# Patient Record
Sex: Female | Born: 1975 | Race: Black or African American | Hispanic: No | Marital: Single | Smoking: Current every day smoker
Health system: Southern US, Community
[De-identification: ages and names within clinical notes are randomized; demographics above are authoritative.]

## PROBLEM LIST (undated history)

## (undated) ENCOUNTER — Inpatient Hospital Stay (HOSPITAL_COMMUNITY): Payer: Self-pay

## (undated) DIAGNOSIS — Z789 Other specified health status: Secondary | ICD-10-CM

## (undated) DIAGNOSIS — D649 Anemia, unspecified: Secondary | ICD-10-CM

## (undated) HISTORY — DX: Anemia, unspecified: D64.9

## (undated) HISTORY — PX: FOOT SURGERY: SHX648

---

## 1997-08-15 ENCOUNTER — Ambulatory Visit (HOSPITAL_COMMUNITY): Admission: RE | Admit: 1997-08-15 | Discharge: 1997-08-15 | Payer: Self-pay | Admitting: *Deleted

## 1997-11-27 ENCOUNTER — Inpatient Hospital Stay (HOSPITAL_COMMUNITY): Admission: AD | Admit: 1997-11-27 | Discharge: 1997-11-27 | Payer: Self-pay | Admitting: Obstetrics

## 1997-12-25 ENCOUNTER — Inpatient Hospital Stay (HOSPITAL_COMMUNITY): Admission: AD | Admit: 1997-12-25 | Discharge: 1997-12-25 | Payer: Self-pay | Admitting: Obstetrics

## 1997-12-26 ENCOUNTER — Inpatient Hospital Stay (HOSPITAL_COMMUNITY): Admission: AD | Admit: 1997-12-26 | Discharge: 1997-12-28 | Payer: Self-pay | Admitting: Obstetrics & Gynecology

## 1998-12-27 ENCOUNTER — Ambulatory Visit (HOSPITAL_COMMUNITY): Admission: RE | Admit: 1998-12-27 | Discharge: 1998-12-27 | Payer: Self-pay

## 1999-04-16 ENCOUNTER — Inpatient Hospital Stay (HOSPITAL_COMMUNITY): Admission: AD | Admit: 1999-04-16 | Discharge: 1999-04-16 | Payer: Self-pay | Admitting: *Deleted

## 1999-04-17 ENCOUNTER — Inpatient Hospital Stay (HOSPITAL_COMMUNITY): Admission: AD | Admit: 1999-04-17 | Discharge: 1999-04-19 | Payer: Self-pay | Admitting: *Deleted

## 2000-09-13 ENCOUNTER — Encounter: Payer: Self-pay | Admitting: Obstetrics & Gynecology

## 2000-09-13 ENCOUNTER — Inpatient Hospital Stay (HOSPITAL_COMMUNITY): Admission: AD | Admit: 2000-09-13 | Discharge: 2000-09-13 | Payer: Self-pay | Admitting: Obstetrics & Gynecology

## 2000-09-15 ENCOUNTER — Other Ambulatory Visit: Admission: RE | Admit: 2000-09-15 | Discharge: 2000-09-15 | Payer: Self-pay | Admitting: Obstetrics and Gynecology

## 2000-11-12 ENCOUNTER — Emergency Department (HOSPITAL_COMMUNITY): Admission: EM | Admit: 2000-11-12 | Discharge: 2000-11-12 | Payer: Self-pay | Admitting: Emergency Medicine

## 2000-12-04 ENCOUNTER — Emergency Department (HOSPITAL_COMMUNITY): Admission: EM | Admit: 2000-12-04 | Discharge: 2000-12-04 | Payer: Self-pay | Admitting: Emergency Medicine

## 2001-08-08 ENCOUNTER — Inpatient Hospital Stay (HOSPITAL_COMMUNITY): Admission: AD | Admit: 2001-08-08 | Discharge: 2001-08-08 | Payer: Self-pay | Admitting: *Deleted

## 2001-10-03 ENCOUNTER — Inpatient Hospital Stay (HOSPITAL_COMMUNITY): Admission: AD | Admit: 2001-10-03 | Discharge: 2001-10-03 | Payer: Self-pay | Admitting: *Deleted

## 2001-10-04 ENCOUNTER — Ambulatory Visit (HOSPITAL_COMMUNITY): Admission: RE | Admit: 2001-10-04 | Discharge: 2001-10-04 | Payer: Self-pay | Admitting: *Deleted

## 2002-02-12 ENCOUNTER — Inpatient Hospital Stay (HOSPITAL_COMMUNITY): Admission: AD | Admit: 2002-02-12 | Discharge: 2002-02-12 | Payer: Self-pay | Admitting: *Deleted

## 2002-03-17 ENCOUNTER — Encounter (HOSPITAL_COMMUNITY): Admission: RE | Admit: 2002-03-17 | Discharge: 2002-03-17 | Payer: Self-pay | Admitting: *Deleted

## 2002-03-21 ENCOUNTER — Inpatient Hospital Stay (HOSPITAL_COMMUNITY): Admission: AD | Admit: 2002-03-21 | Discharge: 2002-03-23 | Payer: Self-pay | Admitting: Family Medicine

## 2002-12-13 ENCOUNTER — Inpatient Hospital Stay (HOSPITAL_COMMUNITY): Admission: AD | Admit: 2002-12-13 | Discharge: 2002-12-13 | Payer: Self-pay | Admitting: *Deleted

## 2003-04-12 ENCOUNTER — Emergency Department (HOSPITAL_COMMUNITY): Admission: EM | Admit: 2003-04-12 | Discharge: 2003-04-13 | Payer: Self-pay | Admitting: Emergency Medicine

## 2003-05-04 ENCOUNTER — Emergency Department (HOSPITAL_COMMUNITY): Admission: EM | Admit: 2003-05-04 | Discharge: 2003-05-04 | Payer: Self-pay | Admitting: Emergency Medicine

## 2003-09-20 ENCOUNTER — Inpatient Hospital Stay (HOSPITAL_COMMUNITY): Admission: AD | Admit: 2003-09-20 | Discharge: 2003-09-20 | Payer: Self-pay | Admitting: Family Medicine

## 2003-11-21 ENCOUNTER — Emergency Department (HOSPITAL_COMMUNITY): Admission: EM | Admit: 2003-11-21 | Discharge: 2003-11-21 | Payer: Self-pay | Admitting: Emergency Medicine

## 2003-11-25 ENCOUNTER — Inpatient Hospital Stay (HOSPITAL_COMMUNITY): Admission: AD | Admit: 2003-11-25 | Discharge: 2003-11-25 | Payer: Self-pay | Admitting: Gynecology

## 2003-12-12 ENCOUNTER — Inpatient Hospital Stay (HOSPITAL_COMMUNITY): Admission: AD | Admit: 2003-12-12 | Discharge: 2003-12-15 | Payer: Self-pay | Admitting: *Deleted

## 2003-12-28 ENCOUNTER — Emergency Department (HOSPITAL_COMMUNITY): Admission: EM | Admit: 2003-12-28 | Discharge: 2003-12-28 | Payer: Self-pay | Admitting: Emergency Medicine

## 2004-03-01 ENCOUNTER — Inpatient Hospital Stay (HOSPITAL_COMMUNITY): Admission: AD | Admit: 2004-03-01 | Discharge: 2004-03-01 | Payer: Self-pay | Admitting: Obstetrics & Gynecology

## 2004-04-16 ENCOUNTER — Inpatient Hospital Stay (HOSPITAL_COMMUNITY): Admission: AD | Admit: 2004-04-16 | Discharge: 2004-04-16 | Payer: Self-pay | Admitting: Obstetrics and Gynecology

## 2004-04-30 ENCOUNTER — Ambulatory Visit: Payer: Self-pay | Admitting: *Deleted

## 2004-05-14 ENCOUNTER — Ambulatory Visit: Payer: Self-pay | Admitting: Family Medicine

## 2004-05-15 ENCOUNTER — Inpatient Hospital Stay (HOSPITAL_COMMUNITY): Admission: AD | Admit: 2004-05-15 | Discharge: 2004-05-17 | Payer: Self-pay | Admitting: Family Medicine

## 2004-11-22 ENCOUNTER — Emergency Department (HOSPITAL_COMMUNITY): Admission: EM | Admit: 2004-11-22 | Discharge: 2004-11-22 | Payer: Self-pay | Admitting: Emergency Medicine

## 2005-08-02 ENCOUNTER — Inpatient Hospital Stay (HOSPITAL_COMMUNITY): Admission: AD | Admit: 2005-08-02 | Discharge: 2005-08-02 | Payer: Self-pay | Admitting: Family Medicine

## 2005-10-13 ENCOUNTER — Inpatient Hospital Stay (HOSPITAL_COMMUNITY): Admission: AD | Admit: 2005-10-13 | Discharge: 2005-10-13 | Payer: Self-pay | Admitting: Gynecology

## 2005-10-29 ENCOUNTER — Ambulatory Visit (HOSPITAL_COMMUNITY): Admission: RE | Admit: 2005-10-29 | Discharge: 2005-10-29 | Payer: Self-pay | Admitting: Gynecology

## 2006-02-02 ENCOUNTER — Ambulatory Visit: Payer: Self-pay | Admitting: Obstetrics & Gynecology

## 2006-02-02 ENCOUNTER — Encounter (INDEPENDENT_AMBULATORY_CARE_PROVIDER_SITE_OTHER): Payer: Self-pay | Admitting: *Deleted

## 2006-02-02 ENCOUNTER — Inpatient Hospital Stay (HOSPITAL_COMMUNITY): Admission: AD | Admit: 2006-02-02 | Discharge: 2006-02-05 | Payer: Self-pay | Admitting: Obstetrics and Gynecology

## 2008-04-06 ENCOUNTER — Emergency Department (HOSPITAL_COMMUNITY): Admission: EM | Admit: 2008-04-06 | Discharge: 2008-04-06 | Payer: Self-pay | Admitting: Emergency Medicine

## 2008-11-20 ENCOUNTER — Ambulatory Visit: Payer: Self-pay | Admitting: Family

## 2008-11-20 ENCOUNTER — Inpatient Hospital Stay (HOSPITAL_COMMUNITY): Admission: AD | Admit: 2008-11-20 | Discharge: 2008-11-20 | Payer: Self-pay | Admitting: Obstetrics & Gynecology

## 2010-02-07 ENCOUNTER — Emergency Department (HOSPITAL_COMMUNITY): Admission: EM | Admit: 2010-02-07 | Discharge: 2010-02-07 | Payer: Self-pay | Admitting: Emergency Medicine

## 2010-08-25 LAB — WET PREP, GENITAL

## 2010-08-25 LAB — URINE MICROSCOPIC-ADD ON

## 2010-08-25 LAB — URINALYSIS, ROUTINE W REFLEX MICROSCOPIC
Ketones, ur: NEGATIVE mg/dL
Leukocytes, UA: NEGATIVE

## 2010-08-25 LAB — GC/CHLAMYDIA PROBE AMP, GENITAL: GC Probe Amp, Genital: NEGATIVE

## 2010-10-04 NOTE — Discharge Summary (Signed)
NAME:  Alicia [name], Alicia [name]                        ACCOUNT NO.:  [id number]   MEDICAL RECORD NO.:  [id number]                   PATIENT TYPE:  INP   LOCATION:  9313                                 [facility]ILITY[facility]  [facility]   PHYSICIAN:  Conni Elliot, M.D.             DATE OF BIRTH:  August 16, 1975   DATE OF ADMISSION:  12/12/2003  DATE OF DISCHARGE:  12/15/2003                                 DISCHARGE SUMMARY   ADMISSION DIAGNOSES:  1. Pyelonephritis.  32. A 35 year old G5, P3-0-1-3 at 19 weeks, 3 days.   DISCHARGE DIAGNOSES:  60. A 35 year old G5, P3-0-1-3 at 19 weeks, 5 days.  2. Resolving pyelonephritis.   DISCHARGE MEDICATIONS:  Keflex 500 mg q.i.d. x7 days, then 500 mg daily for  suppression therapy.   LABORATORY STUDIES:  UA: Appearance cloudy.  Hemoglobin large, protein 100  mg/dL, nitrite negative.  Leukocyte esterase small.  Bacteria is few.  Culture: 20,000 colonies of gram-negative rods.  Specimen was catheterized  urine.   ADMISSION HISTORY:  Alicia [name] presented to the MAU complaining of onset of  frequency, urgency, and pain with urination since the morning of December 12, 2003.  Symptoms became more severe that night.  The patient did not have any  prenatal care.  The patient had ultrasound in the MAU 2 weeks ago when she  came to the MAU with abdominal pain.   HOSPITAL COURSE:  Problem 1. Pyelonephritis.  The patient was started on  cefoxitin 1 g q.6h.  The patient remained afebrile during hospital stay.  Right CVA tenderness noted in MAU on admission resolved by discharge date of  December 15, 2003.  The patient's urine culture was positive for E. coli and  sensitive to cefazolin.  The patient was discharged home on Keflex 500 mg  p.o. daily x7 days, then 500 mg daily for suppression therapy due to the  fact that the patient had been taking Macrobid for suppression therapy for  recurrent UTI.   Problem 2. No prenatal care.  The patient will follow up in [facility]  this  week.  She states she understands the importance of prenatal care and  agrees to comply with plan of care for pregnancy.   INSTRUCTIONS GIVEN TO PATIENT:  The patient was given prescriptions for  prenatal vitamins and prescription for Keflex both to treat the UTI and for  suppression therapy.  The patient was told to follow up in the [facility] and make an appointment this week.  The patient voiced understanding  of plan of care.     Bonnita Hollow, M.D.               Conni Elliot, M.D.    VRE/MEDQ  D:  12/18/2003  T:  12/18/2003  Job:  [id number]

## 2010-10-04 NOTE — Discharge Summary (Signed)
   NAME:  Alicia, [name]                        ACCOUNT NO.:  [id number]   MEDICAL RECORD NO.:  [id number]                   PATIENT TYPE:  INP   LOCATION:  9144                                 [facility]   PHYSICIAN:  Tanya S. Shawnie Pons, M.D.                DATE OF BIRTH:  June 18, 1975   DATE OF ADMISSION:  03/21/2002  DATE OF DISCHARGE:  03/23/2002                                 DISCHARGE SUMMARY   DISCHARGE DIAGNOSES:  1. Status post term vaginal delivery.  2. Postpartum hemorrhage.   DISCHARGE MEDICATIONS:  1. Ibuprofen 600 mg one p.o. q.6h. p.r.n. pain.  2. Depo Provera q.3 months.  3. Prenatal vitamins while breast-feeding.  4. Iron 325 mg one p.o. q.d. x6 weeks.  5. Colace 100 mg one p.o. q.d. while taking iron pills.   DISCHARGE INSTRUCTIONS:  Diet, activity, and sexual activity as per routine.  Follow-up should be at [facility] in six weeks.   BRIEF HOSPITAL COURSE:  The patient is a 35 year old G58, now P3 who  presented to [facility] at 41 and 4/7 weeks with spontaneous rupture  of membranes of clear fluid and contractions and was admitted.  Went on to  have an NSVD under epidural anesthesia.  A viable female infant was  delivered at 0640 on March 21, 2002 with Apgars of 9 at one minute and 9  at five minutes.  Infant was sucked on the perineum.  A three vessel cord  and placenta were spontaneously delivered at 0643.  Estimated blood loss at  delivery was less than 400 cc.  There were no lacerations.  The patient  suffered from postpartum hemorrhage.  Shortly following delivery received  Methergine, Pitocin, and fundal massage to control the bleeding and in  addition received IV fluid bolus and maintenance fluids. The patient was  controlled and decreased over postpartum day number one and two.  Hemoglobin  at discharge was 8.1.   Final plan is to breast and bottle feed her baby.  She was given Depo  Provera IM for contraception.   PRENATAL  LABORATORIES:  Significant for a positive Chlamydia which had been  treated previously during the pregnancy.  Otherwise, O+.  Antibody negative.  RPR nonreactive.  Rubella immune.  HB antigen negative.  GBS negative.  HIV  negative.  GC negative.   SURGEON:  Dr. Shawnie Pons was attending the delivery.   DISPOSITION:  At the time of discharge the patient was feeling well with  minimal lochia and no abdominal tenderness.  She is to be discharged to home  with follow-up and prescriptions as above.     Douglass Rivers, M.D.                      Shelbie Proctor. Shawnie Pons, M.D.    CH/MEDQ  D:  03/23/2002  T:  03/23/2002  Job:  [id number]

## 2010-10-04 NOTE — Discharge Summary (Signed)
NAMEHILLIARY, JOCK              ACCOUNT NO.:  [id number]   MEDICAL RECORD NO.:  [id number]          PATIENT TYPE:  INP   LOCATION:  9319                          [facility]  [facility]   PHYSICIAN:  Phil D. Okey Dupre, M.D.     DATE OF BIRTH:  [birth year]   DATE OF ADMISSION:  02/02/2006  DATE OF DISCHARGE:  02/05/2006                                 DISCHARGE SUMMARY   The patient is a 35 year old gravida 6, para 4-1-1-5 who had a low  transverse cesarean section on the day of admission for nonreassuring fetal  heart, was delivered at 36-3/7 weeks a female infant who was transferred to  the NICU for repair of a congenital heart defect.  The patient had an  unremarkable postoperative course with hematocrit of 31, hemoglobin of 10.7.   She was discharged on ibuprofen, prenatal vitamins, Colace with pelvic rest  for 6 weeks. She is to return to the [facility] for her 6-week checkup.           ______________________________  Javier Glazier. Okey Dupre, M.D.     PDR/MEDQ  D:  03/15/2006  T:  03/16/2006  Job:  [id number]

## 2010-10-04 NOTE — Op Note (Signed)
NAMEVIVIAN, Alicia [name]              ACCOUNT NO.:  [id number]   MEDICAL RECORD NO.:  [id number]          PATIENT TYPE:  INP   LOCATION:  9319                          [facility]   PHYSICIAN:  Lesly Dukes, M.D. DATE OF BIRTH:  25-Nov-1975   DATE OF PROCEDURE:  02/02/2006  DATE OF DISCHARGE:                                 OPERATIVE REPORT   PREOPERATIVE DIAGNOSIS:  This 35 year old G5, P4 at 36-1 weeks estimated  gestational age with decreased fetal movement for two days and nonreassuring  fetal tracings.   POSTOPERATIVE DIAGNOSIS:  This 35 year old G5, P4 at 36-1 weeks estimated  gestational age with decreased fetal movement for two days and nonreassuring  fetal tracings plus intrauterine growth retardation.   PROCEDURE:  Primary low transverse cesarean section.   SURGEON:  Lesly Dukes, M.D.   ASSISTANT:  Marc Morgans. Mayford Knife, M.D.   ANESTHESIA:  Spinal.   PATHOLOGY:  Placenta, to pathology.   ESTIMATED BLOOD LOSS:  800 cc.   COMPLICATIONS:  None.   FINDINGS:  A viable female infant.  Apgars 6 at one minute, 8 at five minutes.  Vertex.  Meconium-stained fluid.  Her fetal cord pH equaled 7.1.  Pediatric  team at delivery.   PROCEDURE:  After informed consent was obtained, the patient was taken to  the operating room, where spinal anesthesia was induced.  The patient was  placed in a dorsal supine position and prepped and draped in a normal  sterile fashion.  The Foley was left in the bladder.  A Pfannenstiel skin  incision was made with the scalpel and carried down to the fascia.  The  fascia was incised and extended bilaterally.  The rectus muscles were  separated from the fascia and separated in the midline.  The peritoneum was  identified and entered bluntly.  The bladder blade was inserted, and the  uterine incision was made in a transverse fashion in the lower uterine  segment.  The baby's head delivered.  The nose and mouth were suctioned.  The body delivered  easily.  The cord was clamped and cut, and the baby was  handed off to the waiting pediatrician.  Cord gas was sent  __________  7.1.  The placenta delivered manually and was sent to pathology.  The uterus was  cleared of all clots and debris.  The uterus was closed with 0 Vicryl in a  running, locked fashion.  A second suture of 0 Monocryl was used to  reinforce the incision.  The gutters were cleared of all clots and debris.  The uterus was noted to be hemostatic one last time.  The rectus muscles and  peritoneum were hemostatic.  The fascia was closed with 0 Vicryl in a  running fashion.  The hemostasis was noted.  Subcutaneous tissues copiously irrigated and found to be hemostasis.  The  __________ , and dressing was placed on top of the incision.  Patient  tolerated the procedure well.  Sponge, lap, instrument, and needle count  correct x2.  Patient went to the recovery room in stable condition.  ______________________________  Lesly Dukes, M.D.     KHL/MEDQ  D:  02/02/2006  T:  02/03/2006  Job:  [id number]

## 2010-11-27 ENCOUNTER — Emergency Department (HOSPITAL_COMMUNITY)
Admission: EM | Admit: 2010-11-27 | Discharge: 2010-11-27 | Disposition: A | Discharge status: Home / Self Care | Payer: Self-pay | Attending: Emergency Medicine | Admitting: Emergency Medicine

## 2010-11-27 DIAGNOSIS — K089 Disorder of teeth and supporting structures, unspecified: Secondary | ICD-10-CM | POA: Insufficient documentation

## 2010-11-27 DIAGNOSIS — K047 Periapical abscess without sinus: Secondary | ICD-10-CM | POA: Insufficient documentation

## 2011-03-12 ENCOUNTER — Emergency Department (HOSPITAL_COMMUNITY)
Admission: EM | Admit: 2011-03-12 | Discharge: 2011-03-12 | Disposition: A | Discharge status: Home / Self Care | Payer: Self-pay | Attending: Emergency Medicine | Admitting: Emergency Medicine

## 2011-03-12 DIAGNOSIS — L0201 Cutaneous abscess of face: Secondary | ICD-10-CM | POA: Insufficient documentation

## 2011-03-12 DIAGNOSIS — L03211 Cellulitis of face: Secondary | ICD-10-CM | POA: Insufficient documentation

## 2012-04-25 ENCOUNTER — Encounter (HOSPITAL_COMMUNITY): Payer: Self-pay | Admitting: *Deleted

## 2012-04-25 ENCOUNTER — Emergency Department (HOSPITAL_COMMUNITY)
Admission: EM | Admit: 2012-04-25 | Discharge: 2012-04-25 | Disposition: A | Discharge status: Home / Self Care | Payer: Medicaid Other | Attending: Emergency Medicine | Admitting: Emergency Medicine

## 2012-04-25 DIAGNOSIS — L03211 Cellulitis of face: Secondary | ICD-10-CM | POA: Insufficient documentation

## 2012-04-25 DIAGNOSIS — K0889 Other specified disorders of teeth and supporting structures: Secondary | ICD-10-CM

## 2012-04-25 DIAGNOSIS — L0201 Cutaneous abscess of face: Secondary | ICD-10-CM | POA: Insufficient documentation

## 2012-04-25 DIAGNOSIS — F17200 Nicotine dependence, unspecified, uncomplicated: Secondary | ICD-10-CM | POA: Insufficient documentation

## 2012-04-25 MED ORDER — HYDROCODONE-ACETAMINOPHEN 5-325 MG PO TABS
1.0000 | ORAL_TABLET | Freq: Once | ORAL | Status: AC
  Administered 2012-04-25: 1 via ORAL
  Filled 2012-04-25: qty 1

## 2012-04-25 MED ORDER — CEPHALEXIN 500 MG PO CAPS: 500.0000 mg | ORAL_CAPSULE | Freq: Four times a day (QID) | ORAL | Status: DC

## 2012-04-25 MED ORDER — IBUPROFEN 800 MG PO TABS: 800.0000 mg | ORAL_TABLET | Freq: Three times a day (TID) | ORAL | Status: DC

## 2012-04-25 MED ORDER — IBUPROFEN 400 MG PO TABS
800.0000 mg | ORAL_TABLET | Freq: Once | ORAL | Status: AC
  Administered 2012-04-25: 800 mg via ORAL
  Filled 2012-04-25: qty 2

## 2012-04-25 MED ORDER — CLINDAMYCIN HCL 150 MG PO CAPS: 300.0000 mg | ORAL_CAPSULE | Freq: Four times a day (QID) | ORAL | Status: DC

## 2012-04-25 NOTE — ED Notes (Signed)
Reports right lower tooth pain x 4 days. Airway intact.

## 2012-04-25 NOTE — ED Provider Notes (Signed)
History   This chart was scribed for Jones Skene, MD by Melba Coon, ED Scribe. The patient was seen in room TR05C/TR05C and the patient's care was started at 11:21AM.    CSN: [id number]  Arrival date & time 04/25/12  1002   First MD Initiated Contact with Patient 04/25/12 1107      Chief Complaint  Patient presents with  . Dental Pain    (Consider location/radiation/quality/duration/timing/severity/associated sxs/prior treatment) The history is provided by the patient. No language interpreter was used.   Alicia [name] is a 36 y.o. female who presents to the Emergency Department complaining of constant, moderate to severe right lower jaw pain pertaining to recurrent dental pain located right lower jaw with an onset 3 days ago. She has had a history of similar pain and swelling for the past year. She reports drainage from the abscess which has been present on the external aspect of her right cheek in the mid jaw line for about one year. Ibuprofen does not alleviate the pain. Reports HA. Denies visual deficits, fever, neck pain, sore throat, rash, back pain, CP, SOB, abdominal pain, nausea, emesis, or diarrhea. No known allergies. No other pertinent medical symptoms.  History reviewed. No pertinent past medical history.  History reviewed. No pertinent past surgical history.  History reviewed. No pertinent family history.  History  Substance Use Topics  . Smoking status: Current Every Day Smoker    Types: Cigarettes  . Smokeless tobacco: Not on file  . Alcohol Use: No    OB History    Grav Para Term Preterm Abortions TAB SAB Ect Mult Living                  Review of Systems 10 Systems reviewed and are negative for acute change except as noted in the HPI.  Allergies  Review of patient's allergies indicates no known allergies.  Home Medications  No current outpatient prescriptions on file.  BP 143/92  Pulse 102  Temp 98.7 F (37.1 C) (Oral)  Resp 18  SpO2  100%  LMP 03/21/2012  Physical Exam  Nursing notes reviewed.  Electronic medical record reviewed. VITAL SIGNS:   Filed Vitals:   04/25/12 1011  BP: 143/92  Pulse: 102  Temp: 98.7 F (37.1 C)  TempSrc: Oral  Resp: 18  SpO2: 100%   CONSTITUTIONAL: Awake, oriented, appears non-toxic HENT: Atraumatic, normocephalic, nickel-sized area of erythema and purulent discharge on the right mid jaw line. There is some honey crusting around a central area of purulent drainage, and eroded tissue, oral mucosa pink and moist, airway patent. Teeth had extensive work before. Tender to percussion at tooth 31. Nares patent without drainage. External ears normal. EYES: Conjunctiva clear, EOMI, PERRLA NECK: Trachea midline, non-tender, supple CARDIOVASCULAR: Normal heart rate, Normal rhythm, No murmurs, rubs, gallops PULMONARY/CHEST: Clear to auscultation, no rhonchi, wheezes, or rales. Symmetrical breath sounds. Non-tender. ABDOMINAL: Non-distended, soft, non-tender - no rebound or guarding.  BS normal. NEUROLOGIC: Non-focal, moving all four extremities, no gross sensory or motor deficits. EXTREMITIES: No clubbing, cyanosis, or edema SKIN: Warm, Dry, No erythema, No rash  ED Course  Procedures (including critical care time)  COORDINATION OF CARE:  11:24AM - pain meds and penicillin will be ordered for Alicia [name]. Hot compresses are advised at home. She is advised to see a dentist and referral will be given today. She is ready for d/c.    Labs Reviewed - No data to display No results found.   1. Pain,  dental   2. Facial abscess       MDM  Alicia [name] is a 36 y.o. female thing with facial abscess and dental pain. Patient's dental pain has been chronic, she has not gone to see a dentist about this. She is nontoxic and afebrile. She is in some discomfort I think that's what her pulse rate of 102 is due to. She does not look overtly septic, there is no surrounding erythema around the  facial abscess and there is no overt abscess in her mouth.  We'll cover the patient with 2 separate antibiotics for the facial abscess which is been present for a year and her mouth pain.  Give her some home pain medicines to go home with as well. The facial abscess is draining, have expressed more purulent material and it seems to be bleeding serous sanguinous fluid at this time. Have urged the patient to continue hot compresses at home to encourage drainage and we'll cover her for both staph and strep as well as covering her dental pain for possible apical abscess. Patient is instructed she needs to followup with a dentist for definitive care.  I explained the diagnosis and have given explicit precautions to return to the ER including high fevers, feeling worse or any other new or worsening symptoms. The patient understands and accepts the medical plan as it's been dictated and I have answered their questions. Discharge instructions concerning home care and prescriptions have been given.  The patient is STABLE and is discharged to home in good condition.   I personally performed the services described in this documentation, which was scribed in my presence. The recorded information has been reviewed and is accurate. Jones Skene, M.D.         Jones Skene, MD 04/25/12 1220

## 2012-04-30 ENCOUNTER — Inpatient Hospital Stay (HOSPITAL_COMMUNITY): Payer: Medicaid Other

## 2012-04-30 ENCOUNTER — Inpatient Hospital Stay (HOSPITAL_COMMUNITY)
Admission: AD | Admit: 2012-04-30 | Discharge: 2012-04-30 | Disposition: A | Discharge status: Home / Self Care | Payer: Medicaid Other | Source: Ambulatory Visit | Attending: Obstetrics and Gynecology | Admitting: Obstetrics and Gynecology

## 2012-04-30 ENCOUNTER — Encounter (HOSPITAL_COMMUNITY): Payer: Self-pay | Admitting: *Deleted

## 2012-04-30 DIAGNOSIS — R109 Unspecified abdominal pain: Secondary | ICD-10-CM | POA: Insufficient documentation

## 2012-04-30 DIAGNOSIS — O99891 Other specified diseases and conditions complicating pregnancy: Secondary | ICD-10-CM | POA: Insufficient documentation

## 2012-04-30 DIAGNOSIS — O26899 Other specified pregnancy related conditions, unspecified trimester: Secondary | ICD-10-CM

## 2012-04-30 LAB — CBC
HCT: 34.7 % — ABNORMAL LOW (ref 36.0–46.0)
MCH: 30.1 pg (ref 26.0–34.0)
MCHC: 32.9 g/dL (ref 30.0–36.0)
MCV: 91.6 fL (ref 78.0–100.0)
Platelets: 269 10*3/uL (ref 150–400)
RBC: 3.79 MIL/uL — ABNORMAL LOW (ref 3.87–5.11)
RDW: 13.2 % (ref 11.5–15.5)
WBC: 5.8 10*3/uL (ref 4.0–10.5)

## 2012-04-30 LAB — URINALYSIS, ROUTINE W REFLEX MICROSCOPIC
Leukocytes, UA: NEGATIVE
Nitrite: NEGATIVE
Protein, ur: NEGATIVE mg/dL
Specific Gravity, Urine: >1.03 — ABNORMAL HIGH (ref 1.005–1.030)

## 2012-04-30 LAB — URINE MICROSCOPIC-ADD ON

## 2012-04-30 LAB — WET PREP, GENITAL
Clue Cells Wet Prep HPF POC: NONE SEEN
Trich, Wet Prep: NONE SEEN
Yeast Wet Prep HPF POC: NONE SEEN

## 2012-04-30 LAB — HCG, QUANTITATIVE, PREGNANCY: hCG, Beta Chain, Quant, S: 35815 m[IU]/mL — ABNORMAL HIGH (ref <5)

## 2012-04-30 LAB — ABO/RH: ABO/RH(D): O POS

## 2012-04-30 NOTE — MAU Note (Signed)
C/o abdominal cramping with some diarrhea; taking meds for abscess tooth (causing diarrhea); +UPT at home;

## 2012-04-30 NOTE — MAU Provider Note (Signed)
Chief Complaint: Abdominal Cramping   First Provider Initiated Contact with Patient 04/30/12 1104     SUBJECTIVE HPI: Alicia [name] is a 36 y.o. [id number] at [redacted]w[redacted]d by LMP who presents with abdominal cramping. The patient denies vaginal bleeding or abnormal discharge. She has been having abdominal cramping associated with loose stools only.She has had two loose stools, once last night and one this morning. She is on Keflex and Clindamycin for a tooth abscess. She also complains of mid-thoracic back pain. She had a positive pregnancy test at home. She has received [facility] care at the [facility] for previous pregnancies.   No past medical history on file. OB History    Grav Para Term Preterm Abortions TAB SAB Ect Mult Living   7 5 5  1  1   5      # Outc Date GA Lbr Len/2nd Wgt Sex Del Anes PTL Lv   1 SAB            2 TRM            3 TRM            4 TRM            5 TRM            6 TRM            7 CUR              Past Surgical History  Procedure Date  . Foot surgery    History   Social History  . Marital Status: Single    Spouse Name: N/A    Number of Children: N/A  . Years of Education: N/A   Occupational History  . Not on file.   Social History Main Topics  . Smoking status: Current Every Day Smoker -- 0.5 packs/day    Types: Cigarettes  . Smokeless tobacco: Not on file  . Alcohol Use: No  . Drug Use: No  . Sexually Active: Yes    Birth Control/ Protection: None   Other Topics Concern  . Not on file   Social History Narrative  . No narrative on file   No current facility-administered medications on file prior to encounter.   Current Outpatient Prescriptions on File Prior to Encounter  Medication Sig Dispense Refill  . cephALEXin (KEFLEX) 500 MG capsule Take 1 capsule (500 mg total) by mouth 4 (four) times daily.  20 capsule  0   No Known Allergies  ROS: Pertinent items in HPI  OBJECTIVE Blood pressure 115/77, pulse 71, temperature 98.2 F (36.8 C),  temperature source Oral, resp. rate 16, height 5\' 3"  (1.6 m), weight 140 lb (63.504 kg), last menstrual period 03/10/2012. GENERAL: Well-developed, well-nourished female in no acute distress.  HEENT: Normocephalic HEART: normal rate RESP: normal effort ABDOMEN: Soft, non-tender EXTREMITIES: Nontender, no edema NEURO: Alert and oriented SPECULUM EXAM: Copious amount of white discharge, no blood noted, cervix clean  BIMANUAL: cervix  closed; uterus normal size, no adnexal tenderness or masses  LAB RESULTS Results for orders placed during the hospital encounter of 04/30/12 (from the past 24 hour(s))  URINALYSIS, ROUTINE W REFLEX MICROSCOPIC     Status: Abnormal   Collection Time   04/30/12 10:38 AM      Component Value Range   Color, Urine YELLOW  YELLOW   APPearance CLEAR  CLEAR   Specific Gravity, Urine >1.030 (*) 1.005 - 1.030   pH 6.0  5.0 - 8.0  Glucose, UA NEGATIVE  NEGATIVE mg/dL   Hgb urine dipstick TRACE (*) NEGATIVE   Bilirubin Urine NEGATIVE  NEGATIVE   Ketones, ur NEGATIVE  NEGATIVE mg/dL   Protein, ur NEGATIVE  NEGATIVE mg/dL   Urobilinogen, UA 0.2  0.0 - 1.0 mg/dL   Nitrite NEGATIVE  NEGATIVE   Leukocytes, UA NEGATIVE  NEGATIVE  URINE MICROSCOPIC-ADD ON     Status: Abnormal   Collection Time   04/30/12 10:38 AM      Component Value Range   Squamous Epithelial / LPF MANY (*) RARE   WBC, UA 0-2  <3 WBC/hpf   RBC / HPF 0-2  <3 RBC/hpf  POCT PREGNANCY, URINE     Status: Abnormal   Collection Time   04/30/12 10:43 AM      Component Value Range   Preg Test, Ur POSITIVE (*) NEGATIVE  WET PREP, GENITAL     Status: Abnormal   Collection Time   04/30/12 11:33 AM      Component Value Range   Yeast Wet Prep HPF POC NONE SEEN  NONE SEEN   Trich, Wet Prep NONE SEEN  NONE SEEN   Clue Cells Wet Prep HPF POC NONE SEEN  NONE SEEN   WBC, Wet Prep HPF POC FEW (*) NONE SEEN  CBC     Status: Abnormal   Collection Time   04/30/12 11:38 AM      Component Value Range   WBC 5.8   4.0 - 10.5 K/uL   RBC 3.79 (*) 3.87 - 5.11 MIL/uL   Hemoglobin 11.4 (*) 12.0 - 15.0 g/dL   HCT 45.4 (*) 09.8 - 11.9 %   MCV 91.6  78.0 - 100.0 fL   MCH 30.1  26.0 - 34.0 pg   MCHC 32.9  30.0 - 36.0 g/dL   RDW 14.7  82.9 - 56.2 %   Platelets 269  150 - 400 K/uL  HCG, QUANTITATIVE, PREGNANCY     Status: Abnormal   Collection Time   04/30/12 11:38 AM      Component Value Range   hCG, Beta Chain, Quant, S 35815 (*) <5 mIU/mL    IMAGING   MAU COURSE   ASSESSMENT Abdominal pain in early pregnancy  PLAN Discharge home Pregnancy verification letter given Patient will contact [facility] to start prenatal care. Contact information for [facility] also given in case patient is unable to start care with [facility] Recommended that patient start PNV as soon as possible Recommended increase PO water intake Patient cautioned to return to MAU should she experience vaginal bleeding or worsening of her abdominal cramping.       Follow-up Information    Follow up with [facility] MATERN[facility] ADMISSIONS. (As needed if symptoms worsen)    Contact information:   [address] [place] [address] [phone]      Call [facility]ALTH DEPT [place]. (to start prenatal care)    Contact information:   [address] [place] [address] [phone]      Call [facility]. (if you are unable to go to health department for prenatal care)    Contact information:   [address] [place] [address] [phone]          Medication List     As of 04/30/2012 12:41 PM    TAKE these medications         cephALEXin 500 MG capsule   Commonly known as: KEFLEX  Take 1 capsule (500 mg total) by mouth 4 (four) times daily.      clindamycin 150 MG capsule   Commonly known as: CLEOCIN   Take 300 mg by mouth every 6 (six) hours.           Freddi Starr, PA-C 04/30/2012  12:41 PM

## 2012-05-01 LAB — GC/CHLAMYDIA PROBE AMP: CT Probe RNA: NEGATIVE

## 2012-05-04 NOTE — MAU Provider Note (Signed)
Attestation of Attending Supervision of Advanced Practitioner (CNM/NP): Evaluation and management procedures were performed by the Advanced Practitioner under my supervision and collaboration.  I have reviewed the Advanced Practitioner's note and chart, and I agree with the management and plan.  Epsie Walthall 05/04/2012 6:03 PM

## 2012-05-19 HISTORY — DX: Maternal care for unspecified type scar from previous cesarean delivery: O34.219

## 2012-05-19 NOTE — L&D Delivery Note (Signed)
Delivery Note At 1:06 AM a viable and healthy female was delivered via Vaginal, Spontaneous Delivery (Presentation: left Occiput Anterior).  APGAR: 8, 9; weight 7#1oz.   Placenta status: Intact, Spontaneous.  Cord: 3 vessels with the following complications: None.   Anesthesia:  epidural Episiotomy: n/a Lacerations: 2nd degree perineal Suture Repair: 3.0 vicryl Est. Blood Loss (mL): 400  Mom to postpartum.  Baby to nursery-stable.  Tawni Carnes 12/28/2012, 1:41 AM  I was present for and assisted with this delivery and agree with the above documentation in the resident's note.    Rulon Abide, M.D. [facility] Fellow 12/28/2012 2:04 AM

## 2012-05-31 ENCOUNTER — Inpatient Hospital Stay (HOSPITAL_COMMUNITY)
Admission: AD | Admit: 2012-05-31 | Discharge: 2012-05-31 | Disposition: A | Discharge status: Home / Self Care | Payer: Medicaid Other | Source: Ambulatory Visit | Attending: Obstetrics & Gynecology | Admitting: Obstetrics & Gynecology

## 2012-05-31 ENCOUNTER — Encounter (HOSPITAL_COMMUNITY): Payer: Self-pay | Admitting: *Deleted

## 2012-05-31 DIAGNOSIS — O99891 Other specified diseases and conditions complicating pregnancy: Secondary | ICD-10-CM | POA: Insufficient documentation

## 2012-05-31 DIAGNOSIS — R109 Unspecified abdominal pain: Secondary | ICD-10-CM | POA: Insufficient documentation

## 2012-05-31 HISTORY — DX: Other specified health status: Z78.9

## 2012-05-31 LAB — CBC WITH DIFFERENTIAL/PLATELET
Basophils Relative: 0 % (ref 0–1)
Eosinophils Absolute: 0.2 10*3/uL (ref 0.0–0.7)
Eosinophils Relative: 4 % (ref 0–5)
HCT: 33.8 % — ABNORMAL LOW (ref 36.0–46.0)
Hemoglobin: 11.1 g/dL — ABNORMAL LOW (ref 12.0–15.0)
MCH: 29.7 pg (ref 26.0–34.0)
MCHC: 32.8 g/dL (ref 30.0–36.0)
MCV: 90.4 fL (ref 78.0–100.0)
Monocytes Absolute: 0.7 10*3/uL (ref 0.1–1.0)
Monocytes Relative: 11 % (ref 3–12)
Neutrophils Relative %: 50 % (ref 43–77)

## 2012-05-31 LAB — URINALYSIS, ROUTINE W REFLEX MICROSCOPIC
Nitrite: NEGATIVE
pH: 6 (ref 5.0–8.0)

## 2012-05-31 LAB — WET PREP, GENITAL: Clue Cells Wet Prep HPF POC: NONE SEEN

## 2012-05-31 NOTE — MAU Provider Note (Signed)
Attestation of Attending Supervision of Advanced Practitioner (PA/CNM/NP): Evaluation and management procedures were performed by the Advanced Practitioner under my supervision and collaboration.  I have reviewed the Advanced Practitioner's note and chart, and I agree with the management and plan.  Liandra Mendia, MD, FACOG Attending Obstetrician & Gynecologist Faculty Practice, [facility]  

## 2012-05-31 NOTE — MAU Note (Signed)
Pt in c/o lower abdominal pains and occas headache.  States abd pains come with sneezing and turning.  Reports pressure with urination.  Denies any bleeding.  Reports small amount of normal discharge.  EDC 12/20/11.

## 2012-05-31 NOTE — MAU Provider Note (Signed)
History     CSN: [id number]  Arrival date and time: 05/31/12 1322   First Provider Initiated Contact with Patient 05/31/12 1358      Chief Complaint  Patient presents with  . Abdominal Pain   HPI Alicia [name] is 37 y.o. [id number] [redacted]w[redacted]d weeks presenting with abdominal pain for 5 days. Headache off and on.  [facility] to [facility] for Medicaid-has appt Jan 15.  Unplanned pregnancy, plans to carry.  Denies vaginal bleeding.  Describes pain as in the lower abdomen that occurs with movement and sneezing.   Pressure with urination with dysuria.  1 sexual partner.    Past Medical History  Diagnosis Date  . No pertinent past medical history     Past Surgical History  Procedure Date  . Foot surgery     Family History  Problem Relation Age of Onset  . Other Neg Hx   . Diabetes Paternal Grandmother     History  Substance Use Topics  . Smoking status: Former Smoker -- 0.5 packs/day    Types: Cigarettes  . Smokeless tobacco: Not on file  . Alcohol Use: No    Allergies: No Known Allergies  No prescriptions prior to admission   Review of Systems  Constitutional: Negative.   HENT: Negative.   Respiratory: Negative.   Cardiovascular: Negative.   Gastrointestinal: Positive for abdominal pain (bilateral side pain).  Genitourinary: Positive for dysuria.       + for white discharge   Physical Exam   Blood pressure 125/68, pulse 86, temperature 98.3 F (36.8 C), temperature source Oral, resp. rate 18, height 5' 4.5" (1.638 m), weight 158 lb (71.668 kg), last menstrual period 03/10/2012.  Physical Exam  Constitutional: She is oriented to person, place, and time. She appears well-developed and well-nourished. No distress.  HENT:  Head: Normocephalic.  Neck: Normal range of motion.  Cardiovascular: Normal rate.   Respiratory: Effort normal.  GI: Soft. She exhibits no distension and no mass. There is no tenderness. There is no rebound and no  guarding.  Genitourinary: There is no rash, tenderness or lesion on the right labia. There is no rash, tenderness or lesion on the left labia. Uterus is enlarged (10-11 week size). Uterus is not tender. Cervix exhibits friability. Cervix exhibits no discharge. Right adnexum displays no mass, no tenderness and no fullness. Left adnexum displays no mass, no tenderness and no fullness. No bleeding around the vagina. Vaginal discharge (moderate amount of white discharge without odor) found.  Neurological: She is alert and oriented to person, place, and time.  Skin: Skin is warm and dry.  Psychiatric: She has a normal mood and affect. Her behavior is normal.   Results for orders placed during the hospital encounter of 05/31/12 (from the past 24 hour(s))  URINALYSIS, ROUTINE W REFLEX MICROSCOPIC     Status: Abnormal   Collection Time   05/31/12  1:37 PM      Component Value Range   Color, Urine YELLOW  YELLOW   APPearance CLEAR  CLEAR   Specific Gravity, Urine 1.025  1.005 - 1.030   pH 6.0  5.0 - 8.0   Glucose, UA NEGATIVE  NEGATIVE mg/dL   Hgb urine dipstick TRACE (*) NEGATIVE   Bilirubin Urine NEGATIVE  NEGATIVE   Ketones, ur 15 (*) NEGATIVE mg/dL   Protein, ur NEGATIVE  NEGATIVE mg/dL   Urobilinogen, UA 0.2  0.0 - 1.0 mg/dL   Nitrite NEGATIVE  NEGATIVE  Leukocytes, UA NEGATIVE  NEGATIVE  URINE MICROSCOPIC-ADD ON     Status: Abnormal   Collection Time   05/31/12  1:37 PM      Component Value Range   Squamous Epithelial / LPF FEW (*) RARE   WBC, UA 3-6  <3 WBC/hpf   RBC / HPF 0-2  <3 RBC/hpf   Bacteria, UA FEW (*) RARE  CBC WITH DIFFERENTIAL     Status: Abnormal   Collection Time   05/31/12  2:08 PM      Component Value Range   WBC 6.2  4.0 - 10.5 K/uL   RBC 3.74 (*) 3.87 - 5.11 MIL/uL   Hemoglobin 11.1 (*) 12.0 - 15.0 g/dL   HCT 16.1 (*) 09.6 - 04.5 %   MCV 90.4  78.0 - 100.0 fL   MCH 29.7  26.0 - 34.0 pg   MCHC 32.8  30.0 - 36.0 g/dL   RDW 40.9  81.1 - 91.4 %   Platelets 256   150 - 400 K/uL   Neutrophils Relative 50  43 - 77 %   Neutro Abs 3.1  1.7 - 7.7 K/uL   Lymphocytes Relative 36  12 - 46 %   Lymphs Abs 2.2  0.7 - 4.0 K/uL   Monocytes Relative 11  3 - 12 %   Monocytes Absolute 0.7  0.1 - 1.0 K/uL   Eosinophils Relative 4  0 - 5 %   Eosinophils Absolute 0.2  0.0 - 0.7 K/uL   Basophils Relative 0  0 - 1 %   Basophils Absolute 0.0  0.0 - 0.1 K/uL  WET PREP, GENITAL     Status: Abnormal   Collection Time   05/31/12  2:30 PM      Component Value Range   Yeast Wet Prep HPF POC NONE SEEN  NONE SEEN   Trich, Wet Prep NONE SEEN  NONE SEEN   Clue Cells Wet Prep HPF POC NONE SEEN  NONE SEEN   WBC, Wet Prep HPF POC FEW (*) NONE SEEN   MAU Course  Procedures   FHR 166 by doppler.  Bedside ultrasound showed + cardiac movement and fetal movement  MDM Assessment and Plan  A:  Abdominal pain in first trimester pregnancy      Neg UA and Wet Prep  P:  Encouraged patient to stay well hydrated     May take tylenol prn for discomfort     Keep scheduled appt to begin prenatal care for Wednesday  Lalena Salas,EVE M 05/31/2012, 2:54 PM

## 2012-05-31 NOTE — MAU Note (Addendum)
Been having pain in lower abd, esp if she sneezes or moves a certain way.  Been having headaches off and on

## 2012-06-01 LAB — URINE CULTURE: Colony Count: 7000

## 2012-06-02 LAB — OB RESULTS CONSOLE RPR: RPR: NONREACTIVE

## 2012-06-02 LAB — OB RESULTS CONSOLE ANTIBODY SCREEN: Antibody Screen: NEGATIVE

## 2012-06-02 LAB — OB RESULTS CONSOLE ABO/RH

## 2012-06-02 LAB — OB RESULTS CONSOLE GC/CHLAMYDIA: Chlamydia: NEGATIVE

## 2012-06-02 LAB — CYSTIC FIBROSIS DIAGNOSTIC STUDY: Interpretation-CFDNA:: NEGATIVE

## 2012-06-03 ENCOUNTER — Other Ambulatory Visit (HOSPITAL_COMMUNITY): Payer: Self-pay | Admitting: Physician Assistant

## 2012-06-03 DIAGNOSIS — O09529 Supervision of elderly multigravida, unspecified trimester: Secondary | ICD-10-CM

## 2012-06-03 DIAGNOSIS — Z3682 Encounter for antenatal screening for nuchal translucency: Secondary | ICD-10-CM

## 2012-06-11 ENCOUNTER — Ambulatory Visit (HOSPITAL_COMMUNITY)
Admission: RE | Admit: 2012-06-11 | Discharge: 2012-06-11 | Disposition: A | Discharge status: Home / Self Care | Payer: Medicaid Other | Source: Ambulatory Visit | Attending: Physician Assistant | Admitting: Physician Assistant

## 2012-06-11 ENCOUNTER — Other Ambulatory Visit: Payer: Self-pay

## 2012-06-11 DIAGNOSIS — O351XX0 Maternal care for (suspected) chromosomal abnormality in fetus, not applicable or unspecified: Secondary | ICD-10-CM | POA: Insufficient documentation

## 2012-06-11 DIAGNOSIS — O34219 Maternal care for unspecified type scar from previous cesarean delivery: Secondary | ICD-10-CM | POA: Insufficient documentation

## 2012-06-11 DIAGNOSIS — O3510X0 Maternal care for (suspected) chromosomal abnormality in fetus, unspecified, not applicable or unspecified: Secondary | ICD-10-CM | POA: Insufficient documentation

## 2012-06-11 DIAGNOSIS — O09299 Supervision of pregnancy with other poor reproductive or obstetric history, unspecified trimester: Secondary | ICD-10-CM | POA: Insufficient documentation

## 2012-06-11 DIAGNOSIS — O09529 Supervision of elderly multigravida, unspecified trimester: Secondary | ICD-10-CM

## 2012-06-11 DIAGNOSIS — O99330 Smoking (tobacco) complicating pregnancy, unspecified trimester: Secondary | ICD-10-CM | POA: Insufficient documentation

## 2012-06-11 DIAGNOSIS — Z3682 Encounter for antenatal screening for nuchal translucency: Secondary | ICD-10-CM

## 2012-06-11 DIAGNOSIS — Z3689 Encounter for other specified antenatal screening: Secondary | ICD-10-CM | POA: Insufficient documentation

## 2012-06-11 DIAGNOSIS — Z8751 Personal history of pre-term labor: Secondary | ICD-10-CM | POA: Insufficient documentation

## 2012-06-14 ENCOUNTER — Encounter (HOSPITAL_COMMUNITY): Payer: Self-pay

## 2012-06-14 DIAGNOSIS — O09529 Supervision of elderly multigravida, unspecified trimester: Secondary | ICD-10-CM | POA: Insufficient documentation

## 2012-06-14 NOTE — Progress Notes (Signed)
Genetic Counseling  High-Risk Gestation Note  Appointment Date:  06/14/2012 Referred By: Alicia Mulling, PA Date of Birth:  1975-09-07    Pregnancy History: [id number] Estimated Date of Delivery: 12/19/12 Estimated Gestational Age: [redacted]w[redacted]d Attending: Particia Nearing, MD   Ms. Alicia [name] was seen for genetic counseling because of a maternal age of 37 y.o..     She was counseled regarding maternal age and the association with risk for chromosome conditions due to nondisjunction with aging of the ova.   We reviewed chromosomes, nondisjunction, and the associated 1 in 114 risk for fetal aneuploidy related to a maternal age of 37 y.o. at [redacted]w[redacted]d gestation.  She was counseled that the risk for aneuploidy decreases as gestational age increases, accounting for those pregnancies which spontaneously abort.  We specifically discussed Down syndrome (trisomy 45), trisomies 25 and 31, and sex chromosome aneuploidies (47,XXX and 47,XXY) including the common features and prognoses of each.   We reviewed other available screening options including First screen, Quad screen, noninvasive prenatal testing (NIPT), and detailed ultrasound. She understands that screening tests are used to modify a patient's a priori risk for aneuploidy, typically based on age.  This estimate provides a pregnancy specific risk assessment.  Specifically, we discussed that NIPT analyzes cell free fetal DNA found in the maternal circulation. This test is not diagnostic for chromosome conditions, but can provide information regarding the presence or absence of extra fetal DNA for chromosomes 13, 18, 21, X, and Y, and missing fetal DNA for chromosome X and Y (Turner syndrome). Thus, it would not identify or rule out all genetic conditions. The reported detection rate is greater than 99% for Trisomy 21, greater than 98% for Trisomy 18, and is approximately 80% (8 out of 10) for Trisomy 13. The false positive rate is reported to be less than 0.1% for  any of these conditions.  In addition, we discussed that ~50-80% of fetuses with Down syndrome and up to 90-95% of fetuses with trisomy 18/13, when well visualized, have detectable anomalies or soft markers by detailed ultrasound (~18+ weeks gestation). She was also counseled regarding diagnostic testing via chorionic villus sampling (CVS) or amniocentesis.  We reviewed the approximate 1 in 100 risk for complications for CVS and the approximate 1 in 300-500 risk for complications for amniocentesis, including spontaneous pregnancy loss. We discussed the risks, limitations, and benefits of each screening and testing option.    A complete ultrasound was performed today and nuchal translucency measurement appeared normal. 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  The ultrasound report will be sent under separate cover. An absent or hypoplastic (undeveloped, or slightly smaller than expected) nasal bone is commonly a normal variation with no associated problems.  This may be a family trait and can be more common in some ethnic groups, such as the African American population.  However, an absent or hypoplastic nasal bone has been shown to increase the chance for fetal aneuploidy, specifically Down syndrome in a pregnancy. We discussed that the ultrasound finding of absent nasal bone would increase the chance for fetal Down syndrome above the patient's age related chance of approximately 1 in 238 to approximately 1 in 28 (3.6%).   After consideration of all the options, Ms. Alicia [name] elected to proceed with cell free fetal DNA testing and declined first trimester screening, CVS, and amniocentesis.  NIPT results will be available in 8-10 days. She understands that ultrasound cannot rule out all  birth defects or genetic syndromes. The patient was advised of this limitation and states she still does not want diagnostic testing at this time. Detailed ultrasound was scheduled for  07/16/12.   Ms. Alicia [name] was provided with written information regarding sickle cell anemia (SCA) including the carrier frequency and incidence in the African-American population, the availability of carrier testing and prenatal diagnosis if indicated. Ms. Alicia [name] reported a female maternal first cousin once removed with sickle cell disease and a nephew with sickle cell trait. We discussed that given this reported family history, Ms. Alicia [name]'s chance to carry sickle cell trait is approximately 1 in 8, prior to screening. However, we discussed that it is less likely that Ms. Alicia [name] and her partner are a carrier couple for hemoglobinopathies given that their children reportedly do not have sickle cell trait or sickle cell disease. We reviewed that carrier screening is available, if not previously performed for her and her partner. In addition, we discussed that hemoglobinopathies are routinely screened for as part of the [place] newborn screening panel.  She declined hemoglobin electrophoresis today.  Both family histories were reviewed and found to be contributory for the couple's previous son with complex congenital heart disease. He is currently 36 years old. The patient reported that he was born at [redacted] weeks gestation and had 3 holes in his heart. He was transferred to [facility] after birth and has not yet required surgical correction. Ms. Alicia [name] reported that two of the holes closed and one of the holes is small. He is followed by [facility]. Ms. Alicia [name] reported that her son also had problems with his lungs, liver, and intestines at birth. She did not have information regarding a specific diagnosis or underlying etiology for his medical concerns. Ms. Alicia [name] was unsure whether or not her son has had a medical genetics evaluation to assess for an underlying cause for his features.   Congenital heart defects occur in approximately 1% of pregnancies.  Congenital heart defects may occur due to  multifactorial influences, chromosomal abnormalities, genetic syndromes or environmental exposures.  Isolated heart defects are generally multifactorial.  However, based on the patient's report of other organ systems requiring medical attention, his heart defect may not be isolated. We discussed that in the case of an isolated congenital heart defect and assuming multifactorial inheritance, the risk for a congenital heart defect in the current pregnancy would be approximately 2-3%. However, in the case of a different underlying etiology, recurrence risk may be altered. If not previously performed, a genetics evaluation is available to the patient's son to assess for an underlying etiology, which may also provide additional information regarding recurrence risk for relatives. We discussed that detailed ultrasound and fetal echocardiogram are available in the current pregnancy to assess fetal heart in detail. However, ultrasound cannot diagnose or rule out all birth defects or genetic condition. Prenatal diagnosis via CVS or amniocentesis would not be expected to be informative regarding her son's features in the current pregnancy given the absence of an identified genetic cause. Without further information regarding the provided family history, an accurate genetic risk cannot be calculated. Further genetic counseling is warranted if more information is obtained.  Ms. Ryant denied exposure to environmental toxins or chemical agents.  She denied the use of alcohol, tobacco or street drugs. She denied significant viral illnesses during the course of her pregnancy. Her medical and surgical histories were noncontributory.   I counseled Ms. Bo Merino Barba regarding the above risks and available  options.  The approximate face-to-face time with the genetic counselor was 40 minutes.  Quinn Plowman, MS,  Certified Genetic Counselor 06/14/2012

## 2012-06-16 ENCOUNTER — Encounter: Payer: Self-pay | Admitting: *Deleted

## 2012-06-16 ENCOUNTER — Encounter: Payer: Self-pay | Admitting: General Practice

## 2012-06-22 ENCOUNTER — Telehealth (HOSPITAL_COMMUNITY): Payer: Self-pay | Admitting: MS"

## 2012-06-22 NOTE — Telephone Encounter (Signed)
Patient previously provided secondary contact number for her as her significant other, Boyd Kerbs at [phone]. Left message for patient to return call.   Alicia [name] 06/22/2012 11:45 AM

## 2012-06-22 NOTE — Telephone Encounter (Signed)
Called Alicia [name] to discuss her [facility], cell free fetal DNA testing. Testing was offered because of advanced maternal age. We reviewed that these are within normal limits, showing a less than 1 in 10,000 risk for trisomies 21, 18 and 13. We reviewed that this testing identifies > 99% of pregnancies with trisomy 21, >98% of pregnancies with trisomy 65, and >80% with trisomy 1; the false positive rate is <0.1% for all conditions. Testing was also performed for X and Y chromosome analysis. This did not show evidence of aneuploidy for X or Y. It was also consistent with female gender. X and Y analysis has a detection rate of approximately 99%. She understands that this testing does not identify all genetic conditions. All questions were answered to her satisfaction, she was encouraged to call with additional questions or concerns.  Quinn Plowman, MS Certified Genetic Counselor 06/22/2012 4:01 PM

## 2012-06-22 NOTE — Telephone Encounter (Signed)
Attempted to call patient regarding cell free fetal DNA testing result. Phone number is not in service.   Alicia [name] Ioannis Schuh 06/22/2012 11:44 AM

## 2012-06-28 ENCOUNTER — Encounter: Payer: Self-pay | Admitting: *Deleted

## 2012-06-29 DIAGNOSIS — O09529 Supervision of elderly multigravida, unspecified trimester: Secondary | ICD-10-CM

## 2012-07-01 ENCOUNTER — Encounter: Payer: Self-pay | Admitting: Obstetrics & Gynecology

## 2012-07-03 ENCOUNTER — Encounter (HOSPITAL_COMMUNITY): Payer: Self-pay | Admitting: *Deleted

## 2012-07-03 ENCOUNTER — Inpatient Hospital Stay (HOSPITAL_COMMUNITY)
Admission: AD | Admit: 2012-07-03 | Discharge: 2012-07-04 | Disposition: A | Discharge status: Home / Self Care | Payer: Medicaid Other | Source: Ambulatory Visit | Attending: Obstetrics & Gynecology | Admitting: Obstetrics & Gynecology

## 2012-07-03 DIAGNOSIS — N949 Unspecified condition associated with female genital organs and menstrual cycle: Secondary | ICD-10-CM | POA: Insufficient documentation

## 2012-07-03 DIAGNOSIS — R079 Chest pain, unspecified: Secondary | ICD-10-CM | POA: Insufficient documentation

## 2012-07-03 DIAGNOSIS — L0291 Cutaneous abscess, unspecified: Secondary | ICD-10-CM

## 2012-07-03 DIAGNOSIS — O99891 Other specified diseases and conditions complicating pregnancy: Secondary | ICD-10-CM | POA: Insufficient documentation

## 2012-07-03 DIAGNOSIS — O09529 Supervision of elderly multigravida, unspecified trimester: Secondary | ICD-10-CM

## 2012-07-03 DIAGNOSIS — R109 Unspecified abdominal pain: Secondary | ICD-10-CM | POA: Insufficient documentation

## 2012-07-03 DIAGNOSIS — O26892 Other specified pregnancy related conditions, second trimester: Secondary | ICD-10-CM

## 2012-07-03 HISTORY — DX: Cutaneous abscess, unspecified: L02.91

## 2012-07-03 LAB — URINE MICROSCOPIC-ADD ON

## 2012-07-03 LAB — URINALYSIS, ROUTINE W REFLEX MICROSCOPIC
Bilirubin Urine: NEGATIVE
Ketones, ur: NEGATIVE mg/dL
Nitrite: NEGATIVE
Urobilinogen, UA: 0.2 mg/dL (ref 0.0–1.0)

## 2012-07-03 LAB — WET PREP, GENITAL
Clue Cells Wet Prep HPF POC: NONE SEEN
Trich, Wet Prep: NONE SEEN

## 2012-07-03 NOTE — MAU Note (Signed)
Pt reports sharp pain in her chest off and on for "a couple of weeks". Pt has stomach pain upper and lower regions which comes and goes. This has been happening for a couple of days. Pt denies nausea, diarrhea or constipation.

## 2012-07-03 NOTE — MAU Provider Note (Signed)
History     CSN: [id number]  Arrival date and time: 07/03/12 2108   None     Chief Complaint  Patient presents with  . Chest Pain  . Abdominal Pain   HPI  Pt is a [id number] at 15.6 wks IUP here with report of lower abdominal cramping that is intermittent in nature.  Pt denies nausea, diarrhea, or constipation.  Also reports pain in mid chest that is intermittent in nature, described as "burning".  Pain is not present at this time. No report of vaginal bleeding, abnormal vaginal discharge, or UTI symptoms.    Past Medical History  Diagnosis Date  . No pertinent past medical history   . Anemia   . Pregnancy induced hypertension     Past Surgical History  Procedure Laterality Date  . Foot surgery    . Cesarean section      Family History  Problem Relation Age of Onset  . Other Neg Hx   . Diabetes Paternal Grandmother   . Congenital heart disease Son   . Hypertension Mother     History  Substance Use Topics  . Smoking status: Current Every Day Smoker -- 0.50 packs/day    Types: Cigarettes  . Smokeless tobacco: Not on file  . Alcohol Use: No    Allergies: No Known Allergies  Prescriptions prior to admission  Medication Sig Dispense Refill  . Prenatal Vit-Fe Fumarate-FA (PRENATAL MULTIVITAMIN) TABS Take 1 tablet by mouth daily.      Marland Kitchen PRESCRIPTION MEDICATION Take 1 tablet by mouth 4 (four) times daily. Penicillin PO        Review of Systems  Constitutional: Negative.   Gastrointestinal: Positive for heartburn and abdominal pain. Negative for nausea, vomiting, diarrhea and constipation.  Genitourinary: Negative.    Physical Exam   Blood pressure 122/69, pulse 79, temperature 98.1 F (36.7 C), temperature source Oral, resp. rate 20, height 5\' 3"  (1.6 m), weight 73.936 kg (163 lb), last menstrual period 03/14/2012, SpO2 100.00%.  Physical Exam  Constitutional: She is oriented to person, place, and time. She appears well-developed and well-nourished. No  distress.  Smiles and laughs during assessment  HENT:  Head: Normocephalic.  Neck: Normal range of motion. Neck supple.  Cardiovascular: Normal rate, regular rhythm and normal heart sounds.  Exam reveals no gallop and no friction rub.   No murmur heard. Respiratory: Effort normal and breath sounds normal. No respiratory distress.  GI: Soft. She exhibits no mass. There is no tenderness. There is no rebound, no guarding and no CVA tenderness.  Fundal height 20 cm  Genitourinary: Uterus is enlarged. Cervix exhibits no motion tenderness and no discharge. Vaginal discharge (white, creamy) found.  Musculoskeletal: Normal range of motion.  Neurological: She is alert and oriented to person, place, and time.  Skin: Skin is warm and dry.  Psychiatric: She has a normal mood and affect.   FHR 153  MAU Course  Procedures  Results for orders placed during the hospital encounter of 07/03/12 (from the past 24 hour(s))  URINALYSIS, ROUTINE W REFLEX MICROSCOPIC     Status: Abnormal   Collection Time    07/03/12  9:30 PM      Result Value Range   Color, Urine YELLOW  YELLOW   APPearance CLEAR  CLEAR   Specific Gravity, Urine 1.025  1.005 - 1.030   pH 6.5  5.0 - 8.0   Glucose, UA NEGATIVE  NEGATIVE mg/dL   Hgb urine dipstick TRACE (*) NEGATIVE   Bilirubin Urine  NEGATIVE  NEGATIVE   Ketones, ur NEGATIVE  NEGATIVE mg/dL   Protein, ur NEGATIVE  NEGATIVE mg/dL   Urobilinogen, UA 0.2  0.0 - 1.0 mg/dL   Nitrite NEGATIVE  NEGATIVE   Leukocytes, UA NEGATIVE  NEGATIVE  URINE MICROSCOPIC-ADD ON     Status: Abnormal   Collection Time    07/03/12  9:30 PM      Result Value Range   Squamous Epithelial / LPF FEW (*) RARE   WBC, UA 0-2  <3 WBC/hpf   RBC / HPF 0-2  <3 RBC/hpf   Bacteria, UA RARE  RARE  WET PREP, GENITAL     Status: None   Collection Time    07/03/12 10:10 PM      Result Value Range   Yeast Wet Prep HPF POC NONE SEEN  NONE SEEN   Trich, Wet Prep NONE SEEN  NONE SEEN   Clue Cells Wet  Prep HPF POC NONE SEEN  NONE SEEN   WBC, Wet Prep HPF POC NONE SEEN  NONE SEEN     Assessment and Plan  Pelvic Pain in Pregnancy - Normal Exam  Plan: DC to home GC/CT pending Provided reassurance    [facility] 07/03/2012, 11:58 PM

## 2012-07-03 NOTE — MAU Note (Addendum)
Sharp pains in mid chest for a month. No n/v. Lower abd pains for couple days. No vag bleeding. Had first appt at [facility] but was cancelled due to bad weather.

## 2012-07-04 DIAGNOSIS — R079 Chest pain, unspecified: Secondary | ICD-10-CM

## 2012-07-04 DIAGNOSIS — R1084 Generalized abdominal pain: Secondary | ICD-10-CM

## 2012-07-04 NOTE — Progress Notes (Signed)
Written and verbal d/c instructions given and understanding voiced. 

## 2012-07-04 NOTE — Progress Notes (Signed)
Threasa Heads CNM in to discuss lab results and d/c plan with pt.

## 2012-07-05 LAB — GC/CHLAMYDIA PROBE AMP
CT Probe RNA: NEGATIVE
GC Probe RNA: NEGATIVE

## 2012-07-06 HISTORY — PX: INCISION AND DRAINAGE INTRA ORAL ABSCESS: SHX1802

## 2012-07-12 ENCOUNTER — Encounter (HOSPITAL_COMMUNITY): Payer: Self-pay | Admitting: *Deleted

## 2012-07-12 ENCOUNTER — Inpatient Hospital Stay (HOSPITAL_COMMUNITY)
Admission: AD | Admit: 2012-07-12 | Discharge: 2012-07-12 | Disposition: A | Discharge status: Home / Self Care | Payer: Medicaid Other | Source: Ambulatory Visit | Attending: Obstetrics & Gynecology | Admitting: Obstetrics & Gynecology

## 2012-07-12 DIAGNOSIS — R109 Unspecified abdominal pain: Secondary | ICD-10-CM | POA: Insufficient documentation

## 2012-07-12 DIAGNOSIS — Z331 Pregnant state, incidental: Secondary | ICD-10-CM | POA: Insufficient documentation

## 2012-07-12 DIAGNOSIS — K122 Cellulitis and abscess of mouth: Secondary | ICD-10-CM

## 2012-07-12 DIAGNOSIS — R51 Headache: Secondary | ICD-10-CM | POA: Insufficient documentation

## 2012-07-12 DIAGNOSIS — K047 Periapical abscess without sinus: Secondary | ICD-10-CM | POA: Insufficient documentation

## 2012-07-12 MED ORDER — IBUPROFEN 800 MG PO TABS: 800.0000 mg | ORAL_TABLET | Freq: Three times a day (TID) | ORAL | Status: DC | PRN

## 2012-07-12 NOTE — MAU Note (Signed)
Patient states that she was in MAU on 2-15. State after she slipped and fell on the ice in your driveway and states she hit her head hard and has been having headaches since that time. States she has been having abdominal pain. Had surgery on her teeth on 2-18.

## 2012-07-12 NOTE — MAU Provider Note (Signed)
  History     CSN: [id number]  Arrival date and time: 07/12/12 4098   First Provider Initiated Contact with Patient 07/12/12 1919      Chief Complaint  Patient presents with  . Fall  . Headache  . Abdominal Pain   HPI 37 y.o. [id number] at [redacted]w[redacted]d with headache every day since fall on 2/15, constant, has taken percocet that was prescribed for tooth surgery, but isn't helping, hasn't tried any thing else for headache. States tooth is still hurting. On PCN, had I&D of oral abscess on 2/18.    Past Medical History  Diagnosis Date  . No pertinent past medical history   . Anemia     Past Surgical History  Procedure Laterality Date  . Foot surgery    . Cesarean section    . Incision and drainage intra oral abscess Right 07/06/2012    Family History  Problem Relation Age of Onset  . Other Neg Hx   . Diabetes Paternal Grandmother   . Congenital heart disease Son   . Hypertension Mother     History  Substance Use Topics  . Smoking status: Current Every Day Smoker -- 0.50 packs/day    Types: Cigarettes  . Smokeless tobacco: Not on file  . Alcohol Use: No    Allergies: No Known Allergies  Prescriptions prior to admission  Medication Sig Dispense Refill  . Prenatal Vit-Fe Fumarate-FA (PRENATAL MULTIVITAMIN) TABS Take 1 tablet by mouth daily.      Marland Kitchen PRESCRIPTION MEDICATION Take 1 tablet by mouth 4 (four) times daily. Penicillin PO        Review of Systems  Constitutional: Negative.   HENT:       + tooth pain   Respiratory: Negative.   Cardiovascular: Negative.   Gastrointestinal: Positive for abdominal pain (intermittent low abd pain, shoots through groin x x 1 mo, no pain now). Negative for nausea, vomiting, diarrhea and constipation.  Genitourinary: Negative for dysuria, urgency, frequency, hematuria and flank pain.       Negative for vaginal bleeding, vaginal discharge, dyspareunia  Musculoskeletal: Negative.   Neurological: Positive for headaches.   Psychiatric/Behavioral: Negative.    Physical Exam   Blood pressure 112/69, pulse 67, temperature 98.9 F (37.2 C), temperature source Oral, resp. rate 18, height 5\' 3"  (1.6 m), weight 165 lb (74.844 kg), last menstrual period 03/14/2012, SpO2 100.00%.  Physical Exam  Nursing note and vitals reviewed. Constitutional: She is oriented to person, place, and time. She appears well-developed and well-nourished. No distress.  Watching TV during exam and interview   HENT:  Head: Normocephalic and atraumatic.  Eyes: Conjunctivae and EOM are normal. Pupils are equal, round, and reactive to light.  Cardiovascular: Normal rate.   Respiratory: Effort normal.  GI: Soft. There is no tenderness.  Neurological: She is alert and oriented to person, place, and time.  Skin: Skin is warm.  Healing wound on right cheek from I&D of oral abscess  Psychiatric: She has a normal mood and affect.    MAU Course  Procedures    Assessment and Plan  37 y.o. [id number] at [redacted]w[redacted]d No pregnancy related concerns today - planning prenatal care in [facility] Recommend f/u with dentist tomorrow re: ongoing tooth pains and headache Recommend f/u at Urgent Care if headaches continue despite resolution of dental abscess  Mackinsey Pelland 07/12/2012, 7:20 PM

## 2012-07-12 NOTE — MAU Note (Signed)
As stated in triage, LLQ pain and lower abd pain for one month, no vaginal bleeding

## 2012-07-14 ENCOUNTER — Other Ambulatory Visit (HOSPITAL_COMMUNITY): Payer: Self-pay | Admitting: Physician Assistant

## 2012-07-14 DIAGNOSIS — O34219 Maternal care for unspecified type scar from previous cesarean delivery: Secondary | ICD-10-CM

## 2012-07-14 DIAGNOSIS — O99340 Other mental disorders complicating pregnancy, unspecified trimester: Secondary | ICD-10-CM

## 2012-07-14 DIAGNOSIS — Z8751 Personal history of pre-term labor: Secondary | ICD-10-CM

## 2012-07-14 DIAGNOSIS — O09299 Supervision of pregnancy with other poor reproductive or obstetric history, unspecified trimester: Secondary | ICD-10-CM

## 2012-07-14 DIAGNOSIS — O09529 Supervision of elderly multigravida, unspecified trimester: Secondary | ICD-10-CM

## 2012-07-16 ENCOUNTER — Encounter (HOSPITAL_COMMUNITY): Payer: Self-pay

## 2012-07-16 ENCOUNTER — Ambulatory Visit (HOSPITAL_COMMUNITY)
Admission: RE | Admit: 2012-07-16 | Discharge: 2012-07-16 | Disposition: A | Discharge status: Home / Self Care | Payer: Medicaid Other | Source: Ambulatory Visit | Attending: Physician Assistant | Admitting: Physician Assistant

## 2012-07-16 VITALS — BP 110/61 | HR 69 | Wt 164.8 lb

## 2012-07-16 DIAGNOSIS — Z8751 Personal history of pre-term labor: Secondary | ICD-10-CM | POA: Insufficient documentation

## 2012-07-16 DIAGNOSIS — O09529 Supervision of elderly multigravida, unspecified trimester: Secondary | ICD-10-CM | POA: Insufficient documentation

## 2012-07-16 DIAGNOSIS — O99330 Smoking (tobacco) complicating pregnancy, unspecified trimester: Secondary | ICD-10-CM | POA: Insufficient documentation

## 2012-07-16 DIAGNOSIS — O09299 Supervision of pregnancy with other poor reproductive or obstetric history, unspecified trimester: Secondary | ICD-10-CM

## 2012-07-16 DIAGNOSIS — Z363 Encounter for antenatal screening for malformations: Secondary | ICD-10-CM | POA: Insufficient documentation

## 2012-07-16 DIAGNOSIS — O09522 Supervision of elderly multigravida, second trimester: Secondary | ICD-10-CM

## 2012-07-16 DIAGNOSIS — O34219 Maternal care for unspecified type scar from previous cesarean delivery: Secondary | ICD-10-CM | POA: Insufficient documentation

## 2012-07-16 DIAGNOSIS — Z1389 Encounter for screening for other disorder: Secondary | ICD-10-CM | POA: Insufficient documentation

## 2012-07-16 DIAGNOSIS — O358XX0 Maternal care for other (suspected) fetal abnormality and damage, not applicable or unspecified: Secondary | ICD-10-CM | POA: Insufficient documentation

## 2012-07-16 DIAGNOSIS — O99340 Other mental disorders complicating pregnancy, unspecified trimester: Secondary | ICD-10-CM

## 2012-07-16 NOTE — Progress Notes (Signed)
Alicia [name]  was seen today for an ultrasound appointment.  See full report in [facility].  Impression: Single IUP at 18 0/7 weeks Normal detailed fetal anatomy.  Somewhat limited views of the fetal heart (aortic and ductal arch) and spine obtained. No markers associated with aneuploidy noted Normal amniotic fluid volume  NIPT (cell free fetal DNA) returned low risk for aneuploidy  Recommendations: Recommend follow-up ultrasound examination in 4 weeks to reevaluate heart and spine.  Alpha Gula, MD

## 2012-07-19 ENCOUNTER — Encounter: Payer: Medicaid Other | Admitting: Obstetrics & Gynecology

## 2012-08-05 ENCOUNTER — Ambulatory Visit (INDEPENDENT_AMBULATORY_CARE_PROVIDER_SITE_OTHER): Payer: Medicaid Other | Admitting: Family Medicine

## 2012-08-05 ENCOUNTER — Encounter: Payer: Self-pay | Admitting: Family Medicine

## 2012-08-05 VITALS — BP 112/73 | Wt 168.4 lb

## 2012-08-05 DIAGNOSIS — O09529 Supervision of elderly multigravida, unspecified trimester: Secondary | ICD-10-CM

## 2012-08-05 DIAGNOSIS — O09299 Supervision of pregnancy with other poor reproductive or obstetric history, unspecified trimester: Secondary | ICD-10-CM

## 2012-08-05 DIAGNOSIS — O34219 Maternal care for unspecified type scar from previous cesarean delivery: Secondary | ICD-10-CM

## 2012-08-05 DIAGNOSIS — O09522 Supervision of elderly multigravida, second trimester: Secondary | ICD-10-CM

## 2012-08-05 DIAGNOSIS — Z302 Encounter for sterilization: Secondary | ICD-10-CM | POA: Insufficient documentation

## 2012-08-05 DIAGNOSIS — O9989 Other specified diseases and conditions complicating pregnancy, childbirth and the puerperium: Secondary | ICD-10-CM

## 2012-08-05 DIAGNOSIS — O0990 Supervision of high risk pregnancy, unspecified, unspecified trimester: Secondary | ICD-10-CM

## 2012-08-05 DIAGNOSIS — Z283 Underimmunization status: Secondary | ICD-10-CM

## 2012-08-05 DIAGNOSIS — O09292 Supervision of pregnancy with other poor reproductive or obstetric history, second trimester: Secondary | ICD-10-CM

## 2012-08-05 LAB — POCT URINALYSIS DIP (DEVICE)
Ketones, ur: NEGATIVE mg/dL
Protein, ur: NEGATIVE mg/dL
Specific Gravity, Urine: >=1.03 (ref 1.005–1.030)
Urobilinogen, UA: 0.2 mg/dL (ref 0.0–1.0)
pH: 6 (ref 5.0–8.0)

## 2012-08-05 NOTE — Progress Notes (Signed)
New OB transfer from [facility] for h/o LBW with possible pre-eclampsia.  Pt. Reports elevated BP and proteinuria. Had C-section, desires TOLAC.

## 2012-08-05 NOTE — Progress Notes (Signed)
Nutrition note: 1st visit consult Pt has gained 14.4# @ [redacted]w[redacted]d, which is slightly > expected. Pt reports eating 2 meals & 3-4 snacks/d. Pt is not taking PNV currently. Pt reports nausea occ but no heartburn. NKFA. Pt received verbal & written education on general nutrition during pregnancy. Disc importance of PNV or 2 chewable multivitamins. Disc wt gain goals of 15-25# or 0.6#/wk. Pt agrees to start taking PNV again. Pt has WIC & plans to BF. F/u if referred Blondell Reveal, MS, RD, LDN

## 2012-08-05 NOTE — Patient Instructions (Addendum)
Pregnancy - Second Trimester The second trimester of pregnancy (3 to 6 months) is a period of rapid growth for you and your baby. At the end of the sixth month, your baby is about 9 inches long and weighs 1 1/2 pounds. You will begin to feel the baby move between 18 and 20 weeks of the pregnancy. This is called quickening. Weight gain is faster. A clear fluid (colostrum) may leak out of your breasts. You may feel small contractions of the womb (uterus). This is known as false labor or Braxton-Hicks contractions. This is like a practice for labor when the baby is ready to be born. Usually, the problems with morning sickness have usually passed by the end of your first trimester. Some women develop small dark blotches (called cholasma, mask of pregnancy) on their face that usually goes away after the baby is born. Exposure to the sun makes the blotches worse. Acne may also develop in some pregnant women and pregnant women who have acne, may find that it goes away. PRENATAL EXAMS  Blood work may continue to be done during prenatal exams. These tests are done to check on your health and the probable health of your baby. Blood work is used to follow your blood levels (hemoglobin). Anemia (low hemoglobin) is common during pregnancy. Iron and vitamins are given to help prevent this. You will also be checked for diabetes between 24 and 28 weeks of the pregnancy. Some of the previous blood tests may be repeated.  The size of the uterus is measured during each visit. This is to make sure that the baby is continuing to grow properly according to the dates of the pregnancy.  Your blood pressure is checked every prenatal visit. This is to make sure you are not getting toxemia.  Your urine is checked to make sure you do not have an infection, diabetes or protein in the urine.  Your weight is checked often to make sure gains are happening at the suggested rate. This is to ensure that both you and your baby are  growing normally.  Sometimes, an ultrasound is performed to confirm the proper growth and development of the baby. This is a test which bounces harmless sound waves off the baby so your caregiver can more accurately determine due dates. Sometimes, a specialized test is done on the amniotic fluid surrounding the baby. This test is called an amniocentesis. The amniotic fluid is obtained by sticking a needle into the belly (abdomen). This is done to check the chromosomes in instances where there is a concern about possible genetic problems with the baby. It is also sometimes done near the end of pregnancy if an early delivery is required. In this case, it is done to help make sure the baby's lungs are mature enough for the baby to live outside of the womb. CHANGES OCCURING IN THE SECOND TRIMESTER OF PREGNANCY Your body goes through many changes during pregnancy. They vary from person to person. Talk to your caregiver about changes you notice that you are concerned about.  During the second trimester, you will likely have an increase in your appetite. It is normal to have cravings for certain foods. This varies from person to person and pregnancy to pregnancy.  Your lower abdomen will begin to bulge.  You may have to urinate more often because the uterus and baby are pressing on your bladder. It is also common to get more bladder infections during pregnancy (pain with urination). You can help this by  drinking lots of fluids and emptying your bladder before and after intercourse.  You may begin to get stretch marks on your hips, abdomen, and breasts. These are normal changes in the body during pregnancy. There are no exercises or medications to take that prevent this change.  You may begin to develop swollen and bulging veins (varicose veins) in your legs. Wearing support hose, elevating your feet for 15 minutes, 3 to 4 times a day and limiting salt in your diet helps lessen the problem.  Heartburn may  develop as the uterus grows and pushes up against the stomach. Antacids recommended by your caregiver helps with this problem. Also, eating smaller meals 4 to 5 times a day helps.  Constipation can be treated with a stool softener or adding bulk to your diet. Drinking lots of fluids, vegetables, fruits, and whole grains are helpful.  Exercising is also helpful. If you have been very active up until your pregnancy, most of these activities can be continued during your pregnancy. If you have been less active, it is helpful to start an exercise program such as walking.  Hemorrhoids (varicose veins in the rectum) may develop at the end of the second trimester. Warm sitz baths and hemorrhoid cream recommended by your caregiver helps hemorrhoid problems.  Backaches may develop during this time of your pregnancy. Avoid heavy lifting, wear low heal shoes and practice good posture to help with backache problems.  Some pregnant women develop tingling and numbness of their hand and fingers because of swelling and tightening of ligaments in the wrist (carpel tunnel syndrome). This goes away after the baby is born.  As your breasts enlarge, you may have to get a bigger bra. Get a comfortable, cotton, support bra. Do not get a nursing bra until the last month of the pregnancy if you will be nursing the baby.  You may get a dark line from your belly button to the pubic area called the linea nigra.  You may develop rosy cheeks because of increase blood flow to the face.  You may develop spider looking lines of the face, neck, arms and chest. These go away after the baby is born. HOME CARE INSTRUCTIONS   It is extremely important to avoid all smoking, herbs, alcohol, and unprescribed drugs during your pregnancy. These chemicals affect the formation and growth of the baby. Avoid these chemicals throughout the pregnancy to ensure the delivery of a healthy infant.  Most of your home care instructions are the same  as suggested for the first trimester of your pregnancy. Keep your caregiver's appointments. Follow your caregiver's instructions regarding medication use, exercise and diet.  During pregnancy, you are providing food for you and your baby. Continue to eat regular, well-balanced meals. Choose foods such as meat, fish, milk and other low fat dairy products, vegetables, fruits, and whole-grain breads and cereals. Your caregiver will tell you of the ideal weight gain.  A physical sexual relationship may be continued up until near the end of pregnancy if there are no other problems. Problems could include early (premature) leaking of amniotic fluid from the membranes, vaginal bleeding, abdominal pain, or other medical or pregnancy problems.  Exercise regularly if there are no restrictions. Check with your caregiver if you are unsure of the safety of some of your exercises. The greatest weight gain will occur in the last 2 trimesters of pregnancy. Exercise will help you:  Control your weight.  Get you in shape for labor and delivery.  Lose weight  after you have the baby.  Wear a good support or jogging bra for breast tenderness during pregnancy. This may help if worn during sleep. Pads or tissues may be used in the bra if you are leaking colostrum.  Do not use hot tubs, steam rooms or saunas throughout the pregnancy.  Wear your seat belt at all times when driving. This protects you and your baby if you are in an accident.  Avoid raw meat, uncooked cheese, cat litter boxes and soil used by cats. These carry germs that can cause birth defects in the baby.  The second trimester is also a good time to visit your dentist for your dental health if this has not been done yet. Getting your teeth cleaned is OK. Use a soft toothbrush. Brush gently during pregnancy.  It is easier to loose urine during pregnancy. Tightening up and strengthening the pelvic muscles will help with this problem. Practice stopping  your urination while you are going to the bathroom. These are the same muscles you need to strengthen. It is also the muscles you would use as if you were trying to stop from passing gas. You can practice tightening these muscles up 10 times a set and repeating this about 3 times per day. Once you know what muscles to tighten up, do not perform these exercises during urination. It is more likely to contribute to an infection by backing up the urine.  Ask for help if you have financial, counseling or nutritional needs during pregnancy. Your caregiver will be able to offer counseling for these needs as well as refer you for other special needs.  Your skin may become oily. If so, wash your face with mild soap, use non-greasy moisturizer and oil or cream based makeup. MEDICATIONS AND DRUG USE IN PREGNANCY  Take prenatal vitamins as directed. The vitamin should contain 1 milligram of folic acid. Keep all vitamins out of reach of children. Only a couple vitamins or tablets containing iron may be fatal to a baby or young child when ingested.  Avoid use of all medications, including herbs, over-the-counter medications, not prescribed or suggested by your caregiver. Only take over-the-counter or prescription medicines for pain, discomfort, or fever as directed by your caregiver. Do not use aspirin.  Let your caregiver also know about herbs you may be using.  Alcohol is related to a number of birth defects. This includes fetal alcohol syndrome. All alcohol, in any form, should be avoided completely. Smoking will cause low birth rate and premature babies.  Street or illegal drugs are very harmful to the baby. They are absolutely forbidden. A baby born to an addicted mother will be addicted at birth. The baby will go through the same withdrawal an adult does. SEEK MEDICAL CARE IF:  You have any concerns or worries during your pregnancy. It is better to call with your questions if you feel they cannot wait,  rather than worry about them. SEEK IMMEDIATE MEDICAL CARE IF:   An unexplained oral temperature above 102 F (38.9 C) develops, or as your caregiver suggests.  You have leaking of fluid from the vagina (birth canal). If leaking membranes are suspected, take your temperature and tell your caregiver of this when you call.  There is vaginal spotting, bleeding, or passing clots. Tell your caregiver of the amount and how many pads are used. Light spotting in pregnancy is common, especially following intercourse.  You develop a bad smelling vaginal discharge with a change in the color from clear  to white.  You continue to feel sick to your stomach (nauseated) and have no relief from remedies suggested. You vomit blood or coffee ground-like materials.  You lose more than 2 pounds of weight or gain more than 2 pounds of weight over 1 week, or as suggested by your caregiver.  You notice swelling of your face, hands, feet, or legs.  You get exposed to [place] measles and have never had them.  You are exposed to fifth disease or chickenpox.  You develop belly (abdominal) pain. Round ligament discomfort is a common non-cancerous (benign) cause of abdominal pain in pregnancy. Your caregiver still must evaluate you.  You develop a bad headache that does not go away.  You develop fever, diarrhea, pain with urination, or shortness of breath.  You develop visual problems, blurry, or double vision.  You fall or are in a car accident or any kind of trauma.  There is mental or physical violence at home. Document Released: 04/29/2001 Document Revised: 07/28/2011 Document Reviewed: 11/01/2008 [facility] Patient Information 2013 [place], [place].  Breastfeeding Deciding to breastfeed is one of the best choices you can make for you and your baby. The information that follows gives a brief overview of the benefits of breastfeeding as well as common topics surrounding breastfeeding. BENEFITS OF  BREASTFEEDING For the baby  The first milk (colostrum) helps the baby's digestive system function better.   There are antibodies in the mother's milk that help the baby fight off infections.   The baby has a lower incidence of asthma, allergies, and sudden infant death syndrome (SIDS).   The nutrients in breast milk are better for the baby than infant formulas, and breast milk helps the baby's brain grow better.   Babies who breastfeed have less gas, colic, and constipation.  For the mother  Breastfeeding helps develop a very special bond between the mother and her baby.   Breastfeeding is convenient, always available at the correct temperature, and costs nothing.   Breastfeeding burns calories in the mother and helps her lose weight that was gained during pregnancy.   Breastfeeding makes the uterus contract back down to normal size faster and slows bleeding following delivery.   Breastfeeding mothers have a lower risk of developing breast cancer.  BREASTFEEDING FREQUENCY  A healthy, full-term baby may breastfeed as often as every hour or space his or her feedings to every 3 hours.   Watch your baby for signs of hunger. Nurse your baby if he or she shows signs of hunger. How often you nurse will vary from baby to baby.   Nurse as often as the baby requests, or when you feel the need to reduce the fullness of your breasts.   Awaken the baby if it has been 3 4 hours since the last feeding.   Frequent feeding will help the mother make more milk and will help prevent problems, such as sore nipples and engorgement of the breasts.  BABY'S POSITION AT THE BREAST  Whether lying down or sitting, be sure that the baby's tummy is facing your tummy.   Support the breast with 4 fingers underneath the breast and the thumb above. Make sure your fingers are well away from the nipple and baby's mouth.   Stroke the baby's lips gently with your finger or nipple.   When the  baby's mouth is open wide enough, place all of your nipple and as much of the areola as possible into your baby's mouth.   Pull the baby in  close so the tip of the nose and the baby's cheeks touch the breast during the feeding.  FEEDINGS AND SUCTION  The length of each feeding varies from baby to baby and from feeding to feeding.   The baby must suck about 2 3 minutes for your milk to get to him or her. This is called a "let down." For this reason, allow the baby to feed on each breast as long as he or she wants. Your baby will end the feeding when he or she has received the right balance of nutrients.   To break the suction, put your finger into the corner of the baby's mouth and slide it between his or her gums before removing your breast from his or her mouth. This will help prevent sore nipples.  HOW TO TELL WHETHER YOUR BABY IS GETTING ENOUGH BREAST MILK. Wondering whether or not your baby is getting enough milk is a common concern among mothers. You can be assured that your baby is getting enough milk if:   Your baby is actively sucking and you hear swallowing.   Your baby seems relaxed and satisfied after a feeding.   Your baby nurses at least 8 12 times in a 24 hour time period. Nurse your baby until he or she unlatches or falls asleep at the first breast (at least 10 20 minutes), then offer the second side.   Your baby is wetting 5 6 disposable diapers (6 8 cloth diapers) in a 24 hour period by 31 82 days of age.   Your baby is having at least 3 4 stools every 24 hours for the first 6 weeks. The stool should be soft and yellow.   Your baby should gain 4 7 ounces per week after he or she is 56 days old.   Your breasts feel softer after nursing.  REDUCING BREAST ENGORGEMENT  In the first week after your baby is born, you may experience signs of breast engorgement. When breasts are engorged, they feel heavy, warm, full, and may be tender to the touch. You can reduce  engorgement if you:   Nurse frequently, every 2 3 hours. Mothers who breastfeed early and often have fewer problems with engorgement.   Place light ice packs on your breasts for 10 20 minutes between feedings. This reduces swelling. Wrap the ice packs in a lightweight towel to protect your skin. Bags of frozen vegetables work well for this purpose.   Take a warm shower or apply warm, moist heat to your breast for 5 10 minutes just before each feeding. This increases circulation and helps the milk flow.   Gently massage your breast before and during the feeding. Using your finger tips, massage from the chest wall towards your nipple in a circular motion.   Make sure that the baby empties at least one breast at every feeding before switching sides.   Use a breast pump to empty the breasts if your baby is sleepy or not nursing well. You may also want to pump if you are returning to work oryou feel you are getting engorged.   Avoid bottle feeds, pacifiers, or supplemental feedings of water or juice in place of breastfeeding. Breast milk is all the food your baby needs. It is not necessary for your baby to have water or formula. In fact, to help your breasts make more milk, it is best not to give your baby supplemental feedings during the early weeks.   Be sure the baby is latched  on and positioned properly while breastfeeding.   Wear a supportive bra, avoiding underwire styles.   Eat a balanced diet with enough fluids.   Rest often, relax, and take your prenatal vitamins to prevent fatigue, stress, and anemia.  If you follow these suggestions, your engorgement should improve in 24 48 hours. If you are still experiencing difficulty, call your lactation consultant or caregiver.  CARING FOR YOURSELF Take care of your breasts  Bathe or shower daily.   Avoid using soap on your nipples.   Start feedings on your left breast at one feeding and on your right breast at the next  feeding.   You will notice an increase in your milk supply 2 5 days after delivery. You may feel some discomfort from engorgement, which makes your breasts very firm and often tender. Engorgement "peaks" out within 24 48 hours. In the meantime, apply warm moist towels to your breasts for 5 10 minutes before feeding. Gentle massage and expression of some milk before feeding will soften your breasts, making it easier for your baby to latch on.   Wear a well-fitting nursing bra, and air dry your nipples for a 3 after each feeding.   Only use cotton bra pads.   Only use pure lanolin on your nipples after nursing. You do not need to wash it off before feeding the baby again. Another option is to express a few drops of breast milk and gently massage it into your nipples.  Take care of yourself  Eat well-balanced meals and nutritious snacks.   Drinking milk, fruit juice, and water to satisfy your thirst (about 8 glasses a day).   Get plenty of rest.  Avoid foods that you notice affect the baby in a bad way.  SEEK MEDICAL CARE IF:   You have difficulty with breastfeeding and need help.   You have a hard, red, sore area on your breast that is accompanied by a fever.   Your baby is too sleepy to eat well or is having trouble sleeping.   Your baby is wetting less than 6 diapers a day, by 27 days of age.   Your baby's skin or white part of his or her eyes is more yellow than it was in the hospital.   You feel depressed.  Document Released: 05/05/2005 Document Revised: 11/04/2011 Document Reviewed: 08/03/2011 [facility] Patient Information 2013 [place], [place]. Vaginal Birth After Cesarean Delivery Vaginal birth after Cesarean delivery (VBAC) is giving birth vaginally after previously delivering a baby by a cesarean. In the past, if a woman had a Cesarean delivery, all births afterwards would be done by Cesarean delivery. This is no longer true. It can be safe for the mother  to try a vaginal delivery after having a Cesarean. The final decision to have a VBAC or repeat Cesarean delivery should be between the patient and her caregiver. The risks and benefits can be discussed relative to the reason for, and the type of the previous Cesarean delivery. WOMEN WHO PLAN TO HAVE A VBAC SHOULD CHECK WITH THEIR DOCTOR TO BE SURE THAT:  The previous Cesarean was done with a low transverse uterine incision (not a vertical classical incision).  The birth canal is big enough for the baby.  There were no other operations on the uterus.  They will have an electronic fetal monitor (EFM) on at all times during labor.  An operating room would be available and ready in case an emergency Cesarean is needed.  A doctor and  surgical nursing staff would be available at all times during labor to be ready to do an emergency Cesarean if necessary.  An anesthesiologist would be present in case an emergency Cesarean is needed.  The nursery is prepared and has adequate personnel and necessary equipment available to care for the baby in case of an emergency Cesarean. BENEFITS OF VBAC:  Shorter stay in the hospital.  Lower delivery, nursery and hospital costs.  Less blood loss and need for blood transfusions.  Less fever and discomfort from major surgery.  Lower risk of blood clots.  Lower risk of infection.  Shorter recovery after going home.  Lower risk of other surgical complications, such as opening of the incision or hernia in the incision.  Decreased risk of injury to other organs.  Decreased risk for having to remove the uterus (hysterectomy).  Decreased risk for the placenta to completely or partially cover the opening of the uterus (placenta previa) with a future pregnancy.  Ability to have a larger family if desired. RISKS OF A VBAC:  Rupture of the uterus.  Having to remove the uterus (hysterectomy) if it ruptures.  All the complications of major surgery and/or  injury to other organs.  Excessive bleeding, blood clots and infection.  Lower Apgar scores (method to evaluate the newborn based on appearance, pulse, grimace, activity, and respiration) and more risks to the baby.  There is a higher risk of uterine rupture if you induce or augment labor.  There is a higher risk of uterine rupture if you use medications to ripen the cervix. VBAC SHOULD NOT BE DONE IF:  The previous Cesarean was done with a vertical (classical) or T-shaped incision, or you do not know what kind of an incision was made.  You had a ruptured uterus.  You had surgery on your uterus.  You have medical or obstetrical problems.  There are problems with the baby.  There were two previous Cesarean deliveries and no vaginal deliveries. OTHER FACTS TO KNOW ABOUT VBAC:  It is safe to have an epidural anesthetic with VBAC.  It is safe to turn the baby from a breech position (attempt an external cephalic version).  It is safe to try a VBAC with twins.  Pregnancies later than 40 weeks have not been successful with VBAC.  There is an increased failure rate of a VBAC in obese pregnant women.  There is an increased failure rate with VABC if the baby weighs 8.8 pounds (4000 grams) or more.  There is an increased failure rate if the time between the Cesarean and VBAC is less than 19 months.  There is an increased failure rate if pre-eclampsia is present (high blood pressure, protein in the urine and swelling of face and extremities).  VBAC is very successful if there was a previous vaginal birth.  VBAC is very successful when the labor starts spontaneously before the due date.  Delivery of VBAC is similar to having a normal spontaneous vaginal delivery. It is important to discuss VBAC with your caregiver early in the pregnancy so you can understand the risks, benefits and options. It will give you time to decide what is best in your particular case relevant to the reason for  your previous Cesarean delivery. It should be understood that medical changes in the mother or pregnancy may occur during the pregnancy, which make it necessary to change you or your caregiver's initial decision. The counseling, concerns and decisions should be documented in the medical record and signed by  all parties. Document Released: 10/26/2006 Document Revised: 07/28/2011 Document Reviewed: 06/16/2008 [facility] Patient Information 2013 [place], [place].

## 2012-08-05 NOTE — Progress Notes (Signed)
Pulse: 84

## 2012-08-13 ENCOUNTER — Ambulatory Visit (HOSPITAL_COMMUNITY)
Admission: RE | Admit: 2012-08-13 | Discharge: 2012-08-13 | Disposition: A | Discharge status: Home / Self Care | Payer: Medicaid Other | Source: Ambulatory Visit | Attending: Obstetrics & Gynecology | Admitting: Obstetrics & Gynecology

## 2012-08-13 ENCOUNTER — Encounter (HOSPITAL_COMMUNITY): Payer: Self-pay

## 2012-08-13 DIAGNOSIS — O09522 Supervision of elderly multigravida, second trimester: Secondary | ICD-10-CM

## 2012-08-13 DIAGNOSIS — O99330 Smoking (tobacco) complicating pregnancy, unspecified trimester: Secondary | ICD-10-CM | POA: Insufficient documentation

## 2012-08-13 DIAGNOSIS — Z3689 Encounter for other specified antenatal screening: Secondary | ICD-10-CM | POA: Insufficient documentation

## 2012-08-13 DIAGNOSIS — O34219 Maternal care for unspecified type scar from previous cesarean delivery: Secondary | ICD-10-CM | POA: Insufficient documentation

## 2012-08-13 DIAGNOSIS — O09529 Supervision of elderly multigravida, unspecified trimester: Secondary | ICD-10-CM | POA: Insufficient documentation

## 2012-08-13 DIAGNOSIS — Z8751 Personal history of pre-term labor: Secondary | ICD-10-CM | POA: Insufficient documentation

## 2012-08-13 DIAGNOSIS — O09299 Supervision of pregnancy with other poor reproductive or obstetric history, unspecified trimester: Secondary | ICD-10-CM | POA: Insufficient documentation

## 2012-08-13 NOTE — Progress Notes (Signed)
Ms. Boza was seen for ultrasound appointment today.  Please see [facility] report for details.

## 2012-09-02 ENCOUNTER — Other Ambulatory Visit: Payer: Self-pay | Admitting: Obstetrics & Gynecology

## 2012-09-02 ENCOUNTER — Ambulatory Visit (INDEPENDENT_AMBULATORY_CARE_PROVIDER_SITE_OTHER): Payer: Medicaid Other | Admitting: Obstetrics & Gynecology

## 2012-09-02 VITALS — BP 121/65 | Temp 97.4°F | Wt 175.0 lb

## 2012-09-02 DIAGNOSIS — A499 Bacterial infection, unspecified: Secondary | ICD-10-CM

## 2012-09-02 DIAGNOSIS — N760 Acute vaginitis: Secondary | ICD-10-CM | POA: Insufficient documentation

## 2012-09-02 DIAGNOSIS — O09299 Supervision of pregnancy with other poor reproductive or obstetric history, unspecified trimester: Secondary | ICD-10-CM

## 2012-09-02 DIAGNOSIS — O0990 Supervision of high risk pregnancy, unspecified, unspecified trimester: Secondary | ICD-10-CM

## 2012-09-02 DIAGNOSIS — O09529 Supervision of elderly multigravida, unspecified trimester: Secondary | ICD-10-CM

## 2012-09-02 DIAGNOSIS — O09292 Supervision of pregnancy with other poor reproductive or obstetric history, second trimester: Secondary | ICD-10-CM

## 2012-09-02 LAB — POCT URINALYSIS DIP (DEVICE)
Ketones, ur: NEGATIVE mg/dL
Protein, ur: NEGATIVE mg/dL
Specific Gravity, Urine: 1.025 (ref 1.005–1.030)
Urobilinogen, UA: 0.2 mg/dL (ref 0.0–1.0)
pH: 6 (ref 5.0–8.0)

## 2012-09-02 MED ORDER — CLINDAMYCIN HCL 150 MG PO CAPS: 150.0000 mg | ORAL_CAPSULE | Freq: Three times a day (TID) | ORAL | Status: DC

## 2012-09-02 NOTE — Progress Notes (Signed)
C/o some vaginal itching, white discharge, and occasional lower abdominal pain.  Reports some itching on stomach.

## 2012-09-02 NOTE — Patient Instructions (Signed)
Bacterial Vaginosis Bacterial vaginosis (BV) is a vaginal infection where the normal balance of bacteria in the vagina is disrupted. The normal balance is then replaced by an overgrowth of certain bacteria. There are several different kinds of bacteria that can cause BV. BV is the most common vaginal infection in women of childbearing age. CAUSES   The cause of BV is not fully understood. BV develops when there is an increase or imbalance of harmful bacteria.  Some activities or behaviors can upset the normal balance of bacteria in the vagina and put women at increased risk including:  Having a new sex partner or multiple sex partners.  Douching.  Using an intrauterine device (IUD) for contraception.  It is not clear what role sexual activity plays in the development of BV. However, women that have never had sexual intercourse are rarely infected with BV. Women do not get BV from toilet seats, bedding, swimming pools or from touching objects around them.  SYMPTOMS   Grey vaginal discharge.  A fish-like odor with discharge, especially after sexual intercourse.  Itching or burning of the vagina and vulva.  Burning or pain with urination.  Some women have no signs or symptoms at all. DIAGNOSIS  Your caregiver must examine the vagina for signs of BV. Your caregiver will perform lab tests and look at the sample of vaginal fluid through a microscope. They will look for bacteria and abnormal cells (clue cells), a pH test higher than 4.5, and a positive amine test all associated with BV.  RISKS AND COMPLICATIONS   Pelvic inflammatory disease (PID).  Infections following gynecology surgery.  Developing HIV.  Developing herpes virus. TREATMENT  Sometimes BV will clear up without treatment. However, all women with symptoms of BV should be treated to avoid complications, especially if gynecology surgery is planned. Female partners generally do not need to be treated. However, BV may spread  between female sex partners so treatment is helpful in preventing a recurrence of BV.   BV may be treated with antibiotics. The antibiotics come in either pill or vaginal cream forms. Either can be used with nonpregnant or pregnant women, but the recommended dosages differ. These antibiotics are not harmful to the baby.  BV can recur after treatment. If this happens, a second round of antibiotics will often be prescribed.  Treatment is important for pregnant women. If not treated, BV can cause a premature delivery, especially for a pregnant woman who had a premature birth in the past. All pregnant women who have symptoms of BV should be checked and treated.  For chronic reoccurrence of BV, treatment with a type of prescribed gel vaginally twice a week is helpful. HOME CARE INSTRUCTIONS   Finish all medication as directed by your caregiver.  Do not have sex until treatment is completed.  Tell your sexual partner that you have a vaginal infection. They should see their caregiver and be treated if they have problems, such as a mild rash or itching.  Practice safe sex. Use condoms. Only have 1 sex partner. PREVENTION  Basic prevention steps can help reduce the risk of upsetting the natural balance of bacteria in the vagina and developing BV:  Do not have sexual intercourse (be abstinent).  Do not douche.  Use all of the medicine prescribed for treatment of BV, even if the signs and symptoms go away.  Tell your sex partner if you have BV. That way, they can be treated, if needed, to prevent reoccurrence. SEEK MEDICAL CARE IF:     Your symptoms are not improving after 3 days of treatment.  You have increased discharge, pain, or fever. MAKE SURE YOU:   Understand these instructions.  Will watch your condition.  Will get help right away if you are not doing well or get worse. FOR MORE INFORMATION  Division of STD Prevention (DSTDP), Centers for Disease Control and Prevention:  www.cdc.gov/std American Social Health Association (ASHA): www.ashastd.org  Document Released: 05/05/2005 Document Revised: 07/28/2011 Document Reviewed: 10/26/2008 ExitCare Patient Information 2013 [employer].  

## 2012-09-02 NOTE — Progress Notes (Signed)
Thin white d/c on exam, wet prep done. Suspect recurrent BV, rx cleocin 7 days

## 2012-09-03 LAB — WET PREP, GENITAL

## 2012-09-09 ENCOUNTER — Encounter: Payer: Self-pay | Admitting: *Deleted

## 2012-09-23 ENCOUNTER — Ambulatory Visit (INDEPENDENT_AMBULATORY_CARE_PROVIDER_SITE_OTHER): Payer: Medicaid Other | Admitting: Obstetrics & Gynecology

## 2012-09-23 VITALS — BP 112/71 | Temp 98.0°F | Wt 177.7 lb

## 2012-09-23 DIAGNOSIS — O09292 Supervision of pregnancy with other poor reproductive or obstetric history, second trimester: Secondary | ICD-10-CM

## 2012-09-23 DIAGNOSIS — O09299 Supervision of pregnancy with other poor reproductive or obstetric history, unspecified trimester: Secondary | ICD-10-CM

## 2012-09-23 LAB — CBC
MCH: 28.8 pg (ref 26.0–34.0)
Platelets: 283 10*3/uL (ref 150–400)
RBC: 3.89 MIL/uL (ref 3.87–5.11)
RDW: 14.2 % (ref 11.5–15.5)
WBC: 6.8 10*3/uL (ref 4.0–10.5)

## 2012-09-23 LAB — POCT URINALYSIS DIP (DEVICE)
Bilirubin Urine: NEGATIVE
Glucose, UA: NEGATIVE mg/dL
Leukocytes, UA: NEGATIVE
Nitrite: NEGATIVE

## 2012-09-23 NOTE — Progress Notes (Signed)
No problems. F/U US in 2 weeks for growth

## 2012-09-23 NOTE — Progress Notes (Signed)
Pulse:80 Feels pressure when she urinates.

## 2012-09-24 LAB — RPR

## 2012-10-01 ENCOUNTER — Ambulatory Visit (HOSPITAL_COMMUNITY)
Admission: RE | Admit: 2012-10-01 | Discharge: 2012-10-01 | Disposition: A | Discharge status: Home / Self Care | Payer: Medicaid Other | Source: Ambulatory Visit | Attending: Obstetrics & Gynecology | Admitting: Obstetrics & Gynecology

## 2012-10-01 DIAGNOSIS — O09299 Supervision of pregnancy with other poor reproductive or obstetric history, unspecified trimester: Secondary | ICD-10-CM | POA: Insufficient documentation

## 2012-10-01 DIAGNOSIS — O09529 Supervision of elderly multigravida, unspecified trimester: Secondary | ICD-10-CM | POA: Insufficient documentation

## 2012-10-01 DIAGNOSIS — O99330 Smoking (tobacco) complicating pregnancy, unspecified trimester: Secondary | ICD-10-CM | POA: Insufficient documentation

## 2012-10-01 DIAGNOSIS — O09292 Supervision of pregnancy with other poor reproductive or obstetric history, second trimester: Secondary | ICD-10-CM

## 2012-10-01 DIAGNOSIS — Z8751 Personal history of pre-term labor: Secondary | ICD-10-CM | POA: Insufficient documentation

## 2012-10-01 DIAGNOSIS — O34219 Maternal care for unspecified type scar from previous cesarean delivery: Secondary | ICD-10-CM | POA: Insufficient documentation

## 2012-10-14 ENCOUNTER — Ambulatory Visit (INDEPENDENT_AMBULATORY_CARE_PROVIDER_SITE_OTHER): Payer: Medicaid Other | Admitting: Obstetrics and Gynecology

## 2012-10-14 ENCOUNTER — Encounter: Payer: Self-pay | Admitting: Obstetrics and Gynecology

## 2012-10-14 VITALS — BP 115/70 | Temp 97.6°F | Wt 176.1 lb

## 2012-10-14 DIAGNOSIS — O0990 Supervision of high risk pregnancy, unspecified, unspecified trimester: Secondary | ICD-10-CM

## 2012-10-14 DIAGNOSIS — O9989 Other specified diseases and conditions complicating pregnancy, childbirth and the puerperium: Secondary | ICD-10-CM

## 2012-10-14 DIAGNOSIS — O09299 Supervision of pregnancy with other poor reproductive or obstetric history, unspecified trimester: Secondary | ICD-10-CM

## 2012-10-14 DIAGNOSIS — Z283 Underimmunization status: Secondary | ICD-10-CM

## 2012-10-14 DIAGNOSIS — O34219 Maternal care for unspecified type scar from previous cesarean delivery: Secondary | ICD-10-CM

## 2012-10-14 DIAGNOSIS — O09529 Supervision of elderly multigravida, unspecified trimester: Secondary | ICD-10-CM

## 2012-10-14 DIAGNOSIS — Z302 Encounter for sterilization: Secondary | ICD-10-CM

## 2012-10-14 DIAGNOSIS — Z3490 Encounter for supervision of normal pregnancy, unspecified, unspecified trimester: Secondary | ICD-10-CM

## 2012-10-14 DIAGNOSIS — O09293 Supervision of pregnancy with other poor reproductive or obstetric history, third trimester: Secondary | ICD-10-CM

## 2012-10-14 DIAGNOSIS — O09523 Supervision of elderly multigravida, third trimester: Secondary | ICD-10-CM

## 2012-10-14 LAB — POCT URINALYSIS DIP (DEVICE)
Ketones, ur: NEGATIVE mg/dL
Leukocytes, UA: NEGATIVE
Nitrite: NEGATIVE
Protein, ur: 30 mg/dL — AB

## 2012-10-14 MED ORDER — PRENATAL MULTIVITAMIN CH: 1.0000 | ORAL_TABLET | Freq: Every day | ORAL | Status: DC

## 2012-10-14 NOTE — Progress Notes (Signed)
Pulse- 90  Edema-feet/ankles  Pain/pressure-lower abd

## 2012-10-14 NOTE — Progress Notes (Signed)
Patient doing well without complaints. FM/PTL precautions reviewed. Advised to elevate lower extremities as much as she can to help decrease edema. 5/16- EFW 1265 gm (50%tile)

## 2012-10-28 ENCOUNTER — Ambulatory Visit (INDEPENDENT_AMBULATORY_CARE_PROVIDER_SITE_OTHER): Payer: Medicaid Other | Admitting: Obstetrics and Gynecology

## 2012-10-28 ENCOUNTER — Encounter: Payer: Self-pay | Admitting: Obstetrics and Gynecology

## 2012-10-28 VITALS — BP 123/76 | Wt 177.0 lb

## 2012-10-28 DIAGNOSIS — O34219 Maternal care for unspecified type scar from previous cesarean delivery: Secondary | ICD-10-CM

## 2012-10-28 DIAGNOSIS — O0990 Supervision of high risk pregnancy, unspecified, unspecified trimester: Secondary | ICD-10-CM

## 2012-10-28 DIAGNOSIS — O9989 Other specified diseases and conditions complicating pregnancy, childbirth and the puerperium: Secondary | ICD-10-CM

## 2012-10-28 DIAGNOSIS — O09523 Supervision of elderly multigravida, third trimester: Secondary | ICD-10-CM

## 2012-10-28 DIAGNOSIS — O09293 Supervision of pregnancy with other poor reproductive or obstetric history, third trimester: Secondary | ICD-10-CM

## 2012-10-28 DIAGNOSIS — Z283 Underimmunization status: Secondary | ICD-10-CM

## 2012-10-28 DIAGNOSIS — O09529 Supervision of elderly multigravida, unspecified trimester: Secondary | ICD-10-CM

## 2012-10-28 DIAGNOSIS — O09299 Supervision of pregnancy with other poor reproductive or obstetric history, unspecified trimester: Secondary | ICD-10-CM

## 2012-10-28 DIAGNOSIS — Z302 Encounter for sterilization: Secondary | ICD-10-CM

## 2012-10-28 LAB — POCT URINALYSIS DIP (DEVICE)
Bilirubin Urine: NEGATIVE
Leukocytes, UA: NEGATIVE
Nitrite: NEGATIVE
Protein, ur: NEGATIVE mg/dL
Urobilinogen, UA: 1 mg/dL (ref 0.0–1.0)
pH: 6 (ref 5.0–8.0)

## 2012-10-28 MED ORDER — PRENATAL MULTIVITAMIN CH
1.0000 | ORAL_TABLET | Freq: Every day | ORAL | Status: DC
Start: 2012-10-28 — End: 2012-12-15

## 2012-10-28 NOTE — Progress Notes (Signed)
5/16 EFW- 1264 gm (50%tile). Patient doing well without complaints. FM/PTL/preeclampsia symptoms reviewed and explained. Will have patient re-sign BTL papers according to new guideline.

## 2012-10-28 NOTE — Progress Notes (Signed)
BTS papers signed and copy given to patient.

## 2012-10-28 NOTE — Progress Notes (Signed)
Pulse- 80 

## 2012-10-29 ENCOUNTER — Encounter: Payer: Self-pay | Admitting: *Deleted

## 2012-11-11 ENCOUNTER — Ambulatory Visit (INDEPENDENT_AMBULATORY_CARE_PROVIDER_SITE_OTHER): Payer: Medicaid Other | Admitting: Family

## 2012-11-11 VITALS — BP 113/68 | Temp 97.3°F | Wt 178.6 lb

## 2012-11-11 DIAGNOSIS — O34219 Maternal care for unspecified type scar from previous cesarean delivery: Secondary | ICD-10-CM

## 2012-11-11 DIAGNOSIS — Z302 Encounter for sterilization: Secondary | ICD-10-CM

## 2012-11-11 DIAGNOSIS — O09529 Supervision of elderly multigravida, unspecified trimester: Secondary | ICD-10-CM

## 2012-11-11 DIAGNOSIS — O0990 Supervision of high risk pregnancy, unspecified, unspecified trimester: Secondary | ICD-10-CM

## 2012-11-11 LAB — POCT URINALYSIS DIP (DEVICE)
Leukocytes, UA: NEGATIVE
Nitrite: NEGATIVE
Protein, ur: 30 mg/dL — AB
Urobilinogen, UA: 1 mg/dL (ref 0.0–1.0)

## 2012-11-11 NOTE — Progress Notes (Signed)
Reports lower pelvic pressure; no vaginal bleeding or leaking of fluid; Denies questions or concerns.

## 2012-11-11 NOTE — Progress Notes (Signed)
Pulse- 78  Edema- feet  Pain/pressure- lower abd

## 2012-11-25 ENCOUNTER — Ambulatory Visit (INDEPENDENT_AMBULATORY_CARE_PROVIDER_SITE_OTHER): Payer: Medicaid Other | Admitting: Obstetrics & Gynecology

## 2012-11-25 VITALS — BP 117/74 | Temp 98.4°F | Wt 183.0 lb

## 2012-11-25 DIAGNOSIS — O09523 Supervision of elderly multigravida, third trimester: Secondary | ICD-10-CM

## 2012-11-25 DIAGNOSIS — O09529 Supervision of elderly multigravida, unspecified trimester: Secondary | ICD-10-CM

## 2012-11-25 DIAGNOSIS — O0990 Supervision of high risk pregnancy, unspecified, unspecified trimester: Secondary | ICD-10-CM

## 2012-11-25 LAB — OB RESULTS CONSOLE GBS: GBS: NEGATIVE

## 2012-11-25 LAB — POCT URINALYSIS DIP (DEVICE)
Glucose, UA: NEGATIVE mg/dL
Nitrite: NEGATIVE
Urobilinogen, UA: 2 mg/dL — ABNORMAL HIGH (ref 0.0–1.0)

## 2012-11-25 NOTE — Progress Notes (Signed)
GBS GC CT today. `

## 2012-11-25 NOTE — Patient Instructions (Signed)

## 2012-11-25 NOTE — Progress Notes (Signed)
Pulse   Edema trace in feet.  

## 2012-11-26 LAB — GC/CHLAMYDIA PROBE AMP
CT Probe RNA: NEGATIVE
GC Probe RNA: NEGATIVE

## 2012-12-02 ENCOUNTER — Ambulatory Visit (INDEPENDENT_AMBULATORY_CARE_PROVIDER_SITE_OTHER): Payer: Medicaid Other | Admitting: Obstetrics & Gynecology

## 2012-12-02 VITALS — BP 118/77 | Temp 97.2°F | Wt 181.0 lb

## 2012-12-02 DIAGNOSIS — O0990 Supervision of high risk pregnancy, unspecified, unspecified trimester: Secondary | ICD-10-CM

## 2012-12-02 DIAGNOSIS — Z23 Encounter for immunization: Secondary | ICD-10-CM

## 2012-12-02 DIAGNOSIS — O34219 Maternal care for unspecified type scar from previous cesarean delivery: Secondary | ICD-10-CM

## 2012-12-02 DIAGNOSIS — O09529 Supervision of elderly multigravida, unspecified trimester: Secondary | ICD-10-CM

## 2012-12-02 LAB — POCT URINALYSIS DIP (DEVICE)
Hgb urine dipstick: NEGATIVE
Leukocytes, UA: NEGATIVE
Nitrite: NEGATIVE
Protein, ur: 30 mg/dL — AB
pH: 6 (ref 5.0–8.0)

## 2012-12-02 MED ORDER — TETANUS-DIPHTH-ACELL PERTUSSIS 5-2.5-18.5 LF-MCG/0.5 IM SUSP
0.5000 mL | Freq: Once | INTRAMUSCULAR | Status: AC
  Administered 2012-12-02: 0.5 mL via INTRAMUSCULAR

## 2012-12-02 NOTE — Progress Notes (Signed)
Edema trace in feet. C/o pelvic pain and pressure.

## 2012-12-02 NOTE — Progress Notes (Signed)
Labor precautions given.

## 2012-12-02 NOTE — Addendum Note (Signed)
Addended by: Kathee Delton on: 12/02/2012 10:13 AM   Modules accepted: Orders

## 2012-12-02 NOTE — Patient Instructions (Signed)

## 2012-12-09 ENCOUNTER — Ambulatory Visit (INDEPENDENT_AMBULATORY_CARE_PROVIDER_SITE_OTHER): Payer: Medicaid Other | Admitting: Family

## 2012-12-09 VITALS — BP 116/80 | Temp 98.1°F | Wt 183.0 lb

## 2012-12-09 DIAGNOSIS — O9989 Other specified diseases and conditions complicating pregnancy, childbirth and the puerperium: Secondary | ICD-10-CM

## 2012-12-09 DIAGNOSIS — O09529 Supervision of elderly multigravida, unspecified trimester: Secondary | ICD-10-CM

## 2012-12-09 DIAGNOSIS — O0990 Supervision of high risk pregnancy, unspecified, unspecified trimester: Secondary | ICD-10-CM

## 2012-12-09 DIAGNOSIS — O34219 Maternal care for unspecified type scar from previous cesarean delivery: Secondary | ICD-10-CM

## 2012-12-09 LAB — POCT URINALYSIS DIP (DEVICE)
Leukocytes, UA: NEGATIVE
Protein, ur: NEGATIVE mg/dL
Specific Gravity, Urine: 1.025 (ref 1.005–1.030)
Urobilinogen, UA: 1 mg/dL (ref 0.0–1.0)
pH: 6 (ref 5.0–8.0)

## 2012-12-09 NOTE — Progress Notes (Signed)
Pulse 78 C/o pelvic pain.

## 2012-12-09 NOTE — Progress Notes (Signed)
No questions or concerns; reviewed GBS results.  Reviewed labor precautions.  Urine results not available at discharge.  Reports fetal movement increased upon arrival to clinic.  Palpated movement during exam.

## 2012-12-15 ENCOUNTER — Inpatient Hospital Stay (HOSPITAL_COMMUNITY)
Admission: AD | Admit: 2012-12-15 | Discharge: 2012-12-15 | Disposition: A | Discharge status: Home / Self Care | Payer: Medicaid Other | Source: Ambulatory Visit | Attending: Obstetrics & Gynecology | Admitting: Obstetrics & Gynecology

## 2012-12-15 ENCOUNTER — Inpatient Hospital Stay (HOSPITAL_COMMUNITY): Payer: Medicaid Other

## 2012-12-15 ENCOUNTER — Encounter (HOSPITAL_COMMUNITY): Payer: Self-pay | Admitting: Family

## 2012-12-15 DIAGNOSIS — O368130 Decreased fetal movements, third trimester, not applicable or unspecified: Secondary | ICD-10-CM

## 2012-12-15 DIAGNOSIS — R109 Unspecified abdominal pain: Secondary | ICD-10-CM | POA: Insufficient documentation

## 2012-12-15 DIAGNOSIS — O368190 Decreased fetal movements, unspecified trimester, not applicable or unspecified: Secondary | ICD-10-CM | POA: Insufficient documentation

## 2012-12-15 DIAGNOSIS — O163 Unspecified maternal hypertension, third trimester: Secondary | ICD-10-CM

## 2012-12-15 LAB — COMPREHENSIVE METABOLIC PANEL
AST: 15 U/L (ref 0–37)
Albumin: 2.5 g/dL — ABNORMAL LOW (ref 3.5–5.2)
Calcium: 9 mg/dL (ref 8.4–10.5)
Chloride: 104 mEq/L (ref 96–112)
Creatinine, Ser: 0.7 mg/dL (ref 0.50–1.10)
Sodium: 133 mEq/L — ABNORMAL LOW (ref 135–145)

## 2012-12-15 LAB — CBC WITH DIFFERENTIAL/PLATELET
Basophils Absolute: 0 10*3/uL (ref 0.0–0.1)
Basophils Relative: 0 % (ref 0–1)
Eosinophils Relative: 2 % (ref 0–5)
HCT: 35.5 % — ABNORMAL LOW (ref 36.0–46.0)
MCHC: 32.4 g/dL (ref 30.0–36.0)
Monocytes Absolute: 0.7 10*3/uL (ref 0.1–1.0)
Neutro Abs: 3.4 10*3/uL (ref 1.7–7.7)
Platelets: 196 10*3/uL (ref 150–400)
RDW: 15.2 % (ref 11.5–15.5)

## 2012-12-15 LAB — PROTEIN / CREATININE RATIO, URINE
Protein Creatinine Ratio: 0.11 (ref 0.00–0.15)
Total Protein, Urine: 21 mg/dL

## 2012-12-15 NOTE — MAU Note (Signed)
Patient presents to MAU with c/o decreased fetal movement today, although reports feeling movement since arrival to hospital. Reports increased lower abdominal pain, radiating to back, since 0600 today.  Denies VB, LOF or contractions. Next appt at [facility] tomorrow at 0900.

## 2012-12-15 NOTE — MAU Provider Note (Signed)
  History     CSN: [id number]  Arrival date and time: 12/15/12 1324   None     Chief Complaint  Patient presents with  . Decreased Fetal Movement   HPI 37 y.o. [id number] at [redacted]w[redacted]d who presents with decreased fetal movement all day today until she arrived at MAU. Then she started to feel baby move. Also reports increased lower abdominal pain, radiating to back, since 0600 today. Denies VB, LOF or contractions. Next appt at [facility] tomorrow at 0900.   Receives care at [facility] for history of preterm, SGA baby (delivered at 80 week). 4 vaginal deliveries then recent c-section for non-reassuring fetal heart tones. Normal one hour GTT, normal genetic screening, normal ultrasound.   OB History   Grav Para Term Preterm Abortions TAB SAB Ect Mult Living   7 5 4 1 1  1   5       Past Medical History  Diagnosis Date  . No pertinent past medical history   . Anemia     Past Surgical History  Procedure Laterality Date  . Foot surgery    . Cesarean section    . Incision and drainage intra oral abscess Right 07/06/2012    Family History  Problem Relation Age of Onset  . Other Neg Hx   . Diabetes Paternal Grandmother   . Congenital heart disease Son   . Hypertension Mother     History  Substance Use Topics  . Smoking status: Former Smoker -- 0.50 packs/day    Types: Cigarettes    Quit date: 06/07/2012  . Smokeless tobacco: Not on file     Comment: Patient stopped smoking when she found out she was pregnant  . Alcohol Use: No    Allergies: No Known Allergies  Prescriptions prior to admission  Medication Sig Dispense Refill  . Prenatal Vit-Fe Fumarate-FA (PRENATAL MULTIVITAMIN) TABS Take 1 tablet by mouth daily at 12 noon.      . [DISCONTINUED] Prenatal Vit-Fe Fumarate-FA (PRENATAL MULTIVITAMIN) TABS Take 1 tablet by mouth daily at 12 noon.  30 tablet  5    ROS See HPI  Physical Exam   Blood pressure 139/92, pulse 74, temperature 98.3 F (36.8 C), temperature source Oral, resp.  rate 18, last menstrual period 03/14/2012.  Physical Exam GEN:  WNWD, no distress HEENT:  NCAT, EOMI, conjunctiva clear NECK:  Supple, non-tender, no thyromegaly, trachea midline CV: RRR, no murmur RESP:  CTAB ABD:  Soft, non-tender, no guarding or rebound, normal bowel sounds EXTREM:  Warm, well perfused, no edema or tenderness NEURO:  Alert, oriented, no focal deficits Cervix:  Dilation: 1 Effacement (%): 50 Station: -3 Exam by:: Dr. Thad Ranger  FHTs:  130, moderate variability, accels present, no decels  MAU Course  Procedures  BPP:  8/8, normal fluid   Assessment and Plan  36 y.o. [id number] at [redacted]w[redacted]d with decreased fetal movement - reactive NST, 8/8 BPP - Feeling baby move in MAU - Stable for discharge home. - Has appt 7/31 in [facility].   Napoleon Form 12/15/2012, 12:45 PM

## 2012-12-16 ENCOUNTER — Ambulatory Visit (INDEPENDENT_AMBULATORY_CARE_PROVIDER_SITE_OTHER): Payer: Medicaid Other | Admitting: Obstetrics & Gynecology

## 2012-12-16 ENCOUNTER — Encounter: Payer: Self-pay | Admitting: Obstetrics & Gynecology

## 2012-12-16 VITALS — BP 116/79 | Wt 181.7 lb

## 2012-12-16 DIAGNOSIS — O09293 Supervision of pregnancy with other poor reproductive or obstetric history, third trimester: Secondary | ICD-10-CM

## 2012-12-16 DIAGNOSIS — O09299 Supervision of pregnancy with other poor reproductive or obstetric history, unspecified trimester: Secondary | ICD-10-CM

## 2012-12-16 LAB — POCT URINALYSIS DIP (DEVICE)
Bilirubin Urine: NEGATIVE
Ketones, ur: NEGATIVE mg/dL

## 2012-12-16 NOTE — Progress Notes (Signed)
NST and BPP nl yesterday in MAU.

## 2012-12-16 NOTE — Progress Notes (Signed)
Pulse: 78 Pt states she went to ER last night because of elevated bp.

## 2012-12-16 NOTE — Patient Instructions (Signed)

## 2012-12-20 ENCOUNTER — Ambulatory Visit (INDEPENDENT_AMBULATORY_CARE_PROVIDER_SITE_OTHER): Payer: Medicaid Other | Admitting: Family Medicine

## 2012-12-20 VITALS — BP 123/81 | Wt 182.9 lb

## 2012-12-20 DIAGNOSIS — O09299 Supervision of pregnancy with other poor reproductive or obstetric history, unspecified trimester: Secondary | ICD-10-CM

## 2012-12-20 DIAGNOSIS — O480 Post-term pregnancy: Secondary | ICD-10-CM

## 2012-12-20 DIAGNOSIS — O09293 Supervision of pregnancy with other poor reproductive or obstetric history, third trimester: Secondary | ICD-10-CM

## 2012-12-20 DIAGNOSIS — O0990 Supervision of high risk pregnancy, unspecified, unspecified trimester: Secondary | ICD-10-CM

## 2012-12-20 DIAGNOSIS — O34219 Maternal care for unspecified type scar from previous cesarean delivery: Secondary | ICD-10-CM

## 2012-12-20 LAB — POCT URINALYSIS DIP (DEVICE)
Bilirubin Urine: NEGATIVE
Glucose, UA: NEGATIVE mg/dL
Ketones, ur: NEGATIVE mg/dL
Specific Gravity, Urine: 1.015 (ref 1.005–1.030)

## 2012-12-20 NOTE — Progress Notes (Signed)
Pulse: 82

## 2012-12-20 NOTE — Progress Notes (Signed)
Pt is a [id number] @ 40w1.  No ctx, no vb, no LOF   O; see flowsheet  A/P  - NST today for post-dates- reviewed and beautifully reactive - labor precuations discussed - IOL scheduled for 41 wks

## 2012-12-21 ENCOUNTER — Encounter (HOSPITAL_COMMUNITY): Payer: Self-pay | Admitting: *Deleted

## 2012-12-21 ENCOUNTER — Telehealth (HOSPITAL_COMMUNITY): Payer: Self-pay | Admitting: *Deleted

## 2012-12-21 NOTE — Telephone Encounter (Signed)
Preadmission screen  

## 2012-12-23 ENCOUNTER — Other Ambulatory Visit: Payer: Self-pay | Admitting: Family Medicine

## 2012-12-23 ENCOUNTER — Other Ambulatory Visit: Payer: Medicaid Other

## 2012-12-23 ENCOUNTER — Ambulatory Visit (HOSPITAL_COMMUNITY): Admission: RE | Admit: 2012-12-23 | Payer: Medicaid Other | Source: Ambulatory Visit

## 2012-12-23 ENCOUNTER — Ambulatory Visit (HOSPITAL_COMMUNITY)
Admission: RE | Admit: 2012-12-23 | Discharge: 2012-12-23 | Disposition: A | Discharge status: Home / Self Care | Payer: Medicaid Other | Source: Ambulatory Visit | Attending: Family Medicine | Admitting: Family Medicine

## 2012-12-23 DIAGNOSIS — O09529 Supervision of elderly multigravida, unspecified trimester: Secondary | ICD-10-CM

## 2012-12-23 DIAGNOSIS — Z8751 Personal history of pre-term labor: Secondary | ICD-10-CM | POA: Insufficient documentation

## 2012-12-23 DIAGNOSIS — O34219 Maternal care for unspecified type scar from previous cesarean delivery: Secondary | ICD-10-CM | POA: Insufficient documentation

## 2012-12-23 DIAGNOSIS — O09299 Supervision of pregnancy with other poor reproductive or obstetric history, unspecified trimester: Secondary | ICD-10-CM

## 2012-12-23 DIAGNOSIS — O480 Post-term pregnancy: Secondary | ICD-10-CM | POA: Insufficient documentation

## 2012-12-23 DIAGNOSIS — O99330 Smoking (tobacco) complicating pregnancy, unspecified trimester: Secondary | ICD-10-CM | POA: Insufficient documentation

## 2012-12-23 NOTE — Progress Notes (Signed)
Alicia [name]  was seen today for an ultrasound appointment.  See full report in [facility].  Impression: Single IUP at 40 4/7 weeks Active fetus - BPP 8/8 Normal amniotic fluid volume (AFI 9.5 cm)  Recommendations: Follow-up ultrasounds as clinically indicated.  Tentatively scheduled for induction of labor on 12/26/2012  Alpha Gula, MD

## 2012-12-26 ENCOUNTER — Inpatient Hospital Stay (HOSPITAL_COMMUNITY)
Admission: RE | Admit: 2012-12-26 | Discharge: 2012-12-30 | DRG: 775 | Disposition: A | Discharge status: Home / Self Care | Payer: Medicaid Other | Source: Ambulatory Visit | Attending: Obstetrics and Gynecology | Admitting: Obstetrics and Gynecology

## 2012-12-26 DIAGNOSIS — O480 Post-term pregnancy: Principal | ICD-10-CM | POA: Diagnosis present

## 2012-12-26 DIAGNOSIS — O0990 Supervision of high risk pregnancy, unspecified, unspecified trimester: Secondary | ICD-10-CM

## 2012-12-26 DIAGNOSIS — Z283 Underimmunization status: Secondary | ICD-10-CM

## 2012-12-26 DIAGNOSIS — O09529 Supervision of elderly multigravida, unspecified trimester: Secondary | ICD-10-CM | POA: Diagnosis present

## 2012-12-26 DIAGNOSIS — O34219 Maternal care for unspecified type scar from previous cesarean delivery: Secondary | ICD-10-CM | POA: Diagnosis present

## 2012-12-26 DIAGNOSIS — Z302 Encounter for sterilization: Secondary | ICD-10-CM

## 2012-12-26 LAB — TYPE AND SCREEN: ABO/RH(D): O POS

## 2012-12-26 LAB — COMPREHENSIVE METABOLIC PANEL
ALT: 9 U/L (ref 0–35)
Calcium: 9.1 mg/dL (ref 8.4–10.5)
GFR calc Af Amer: >90 mL/min (ref >90)
Glucose, Bld: 97 mg/dL (ref 70–99)
Sodium: 132 mEq/L — ABNORMAL LOW (ref 135–145)
Total Protein: 6.5 g/dL (ref 6.0–8.3)

## 2012-12-26 LAB — CBC
MCH: 27.1 pg (ref 26.0–34.0)
MCHC: 32.9 g/dL (ref 30.0–36.0)
MCV: 82.6 fL (ref 78.0–100.0)
Platelets: 191 10*3/uL (ref 150–400)

## 2012-12-26 LAB — RPR: RPR Ser Ql: NONREACTIVE

## 2012-12-26 MED ORDER — FENTANYL CITRATE 0.05 MG/ML IJ SOLN
INTRAMUSCULAR | Status: AC
  Filled 2012-12-26: qty 2

## 2012-12-26 MED ORDER — TERBUTALINE SULFATE 1 MG/ML IJ SOLN: 0.2500 mg | Freq: Once | INTRAMUSCULAR | Status: AC | PRN

## 2012-12-26 MED ORDER — CITRIC ACID-SODIUM CITRATE 334-500 MG/5ML PO SOLN: 30.0000 mL | ORAL | Status: DC | PRN

## 2012-12-26 MED ORDER — ACETAMINOPHEN 325 MG PO TABS: 650.0000 mg | ORAL_TABLET | ORAL | Status: DC | PRN

## 2012-12-26 MED ORDER — OXYCODONE-ACETAMINOPHEN 5-325 MG PO TABS: 1.0000 | ORAL_TABLET | ORAL | Status: DC | PRN

## 2012-12-26 MED ORDER — FENTANYL 2.5 MCG/ML BUPIVACAINE 1/10 % EPIDURAL INFUSION (WH - ANES)
14.0000 mL/h | INTRAMUSCULAR | Status: DC | PRN
  Filled 2012-12-26: qty 125

## 2012-12-26 MED ORDER — EPHEDRINE 5 MG/ML INJ
10.0000 mg | INTRAVENOUS | Status: DC | PRN
  Filled 2012-12-26: qty 2

## 2012-12-26 MED ORDER — OXYTOCIN 40 UNITS IN LACTATED RINGERS INFUSION - SIMPLE MED
1.0000 m[IU]/min | INTRAVENOUS | Status: DC
  Administered 2012-12-26: 2 m[IU]/min via INTRAVENOUS
  Filled 2012-12-26: qty 1000

## 2012-12-26 MED ORDER — OXYTOCIN 40 UNITS IN LACTATED RINGERS INFUSION - SIMPLE MED: 1.0000 m[IU]/min | INTRAVENOUS | Status: DC

## 2012-12-26 MED ORDER — ONDANSETRON HCL 4 MG/2ML IJ SOLN: 4.0000 mg | Freq: Four times a day (QID) | INTRAMUSCULAR | Status: DC | PRN

## 2012-12-26 MED ORDER — FENTANYL CITRATE 0.05 MG/ML IJ SOLN
100.0000 ug | INTRAMUSCULAR | Status: DC | PRN
  Administered 2012-12-26 – 2012-12-27 (×3): 100 ug via INTRAVENOUS
  Filled 2012-12-26 (×3): qty 2

## 2012-12-26 MED ORDER — DIPHENHYDRAMINE HCL 50 MG/ML IJ SOLN: 12.5000 mg | INTRAMUSCULAR | Status: DC | PRN

## 2012-12-26 MED ORDER — EPHEDRINE 5 MG/ML INJ
10.0000 mg | INTRAVENOUS | Status: DC | PRN
  Filled 2012-12-26: qty 4
  Filled 2012-12-26: qty 2

## 2012-12-26 MED ORDER — FLEET ENEMA 7-19 GM/118ML RE ENEM: 1.0000 | ENEMA | RECTAL | Status: DC | PRN

## 2012-12-26 MED ORDER — OXYTOCIN 40 UNITS IN LACTATED RINGERS INFUSION - SIMPLE MED
62.5000 mL/h | INTRAVENOUS | Status: DC
  Administered 2012-12-27: 6 m[IU]/min via INTRAVENOUS
  Administered 2012-12-28: 62.5 mL/h via INTRAVENOUS

## 2012-12-26 MED ORDER — IBUPROFEN 600 MG PO TABS: 600.0000 mg | ORAL_TABLET | Freq: Four times a day (QID) | ORAL | Status: DC | PRN

## 2012-12-26 MED ORDER — OXYTOCIN BOLUS FROM INFUSION
500.0000 mL | INTRAVENOUS | Status: DC
  Administered 2012-12-28: 500 mL via INTRAVENOUS

## 2012-12-26 MED ORDER — LACTATED RINGERS IV SOLN: 500.0000 mL | Freq: Once | INTRAVENOUS | Status: DC

## 2012-12-26 MED ORDER — LACTATED RINGERS IV SOLN
INTRAVENOUS | Status: DC
  Administered 2012-12-26 – 2012-12-27 (×4): via INTRAVENOUS

## 2012-12-26 MED ORDER — LIDOCAINE HCL (PF) 1 % IJ SOLN
30.0000 mL | INTRAMUSCULAR | Status: DC | PRN
  Filled 2012-12-26 (×2): qty 30

## 2012-12-26 MED ORDER — LACTATED RINGERS IV SOLN: 500.0000 mL | INTRAVENOUS | Status: DC | PRN

## 2012-12-26 MED ORDER — PHENYLEPHRINE 40 MCG/ML (10ML) SYRINGE FOR IV PUSH (FOR BLOOD PRESSURE SUPPORT)
80.0000 ug | PREFILLED_SYRINGE | INTRAVENOUS | Status: DC | PRN
  Filled 2012-12-26: qty 2

## 2012-12-26 MED ORDER — PHENYLEPHRINE 40 MCG/ML (10ML) SYRINGE FOR IV PUSH (FOR BLOOD PRESSURE SUPPORT)
80.0000 ug | PREFILLED_SYRINGE | INTRAVENOUS | Status: DC | PRN
  Filled 2012-12-26: qty 2
  Filled 2012-12-26: qty 5

## 2012-12-26 NOTE — Progress Notes (Signed)
Foley bulb out, SVE 3-4 CMs, -3 station, vertex. Candise Che, RN

## 2012-12-26 NOTE — Progress Notes (Signed)
Dr Emelda Fear on unit.  Updated on pt status.  Keep pitocin at 20 mu/min.

## 2012-12-26 NOTE — H&P (Signed)
Attestation of Attending Supervision of Advanced Practitioner: Evaluation and management procedures were performed by the PA/NP/CNM/OB Fellow under my supervision/collaboration. Chart reviewed and agree with management and plan. AGA infant suspected. VBAC consent in EMR confirmed.   Keo Schirmer V 12/26/2012 12:03 PM

## 2012-12-26 NOTE — Progress Notes (Signed)
SVE, about 4 cm's of balloon bulging through cervix but still firmly in place. Pt C/O pressure and painful contractions. Palpated as mild to moderate contractions, about 4-6 mins apart.

## 2012-12-26 NOTE — H&P (Signed)
Alicia [name] is a 37 y.o. female [phone] with IUP at [redacted]w[redacted]d by 6wk CRL presenting for post dates IOL. Pt states she has been having occasional back contractions, associated with no vaginal bleeding.  Membranes are intact, with active fetal movement.     [facility] at [facility] and then transferred to [facility] due to hx of previous c/s for NRFHT @ 36wks with SGA baby. PNC this time has been uncomplicated. Largest baby 8lbs.    Prenatal History/Complications:  Past Medical History: Past Medical History  Diagnosis Date  . No pertinent past medical history   . Anemia     Past Surgical History: Past Surgical History  Procedure Laterality Date  . Foot surgery    . Cesarean section    . Incision and drainage intra oral abscess Right 07/06/2012    Obstetrical History: OB History   Grav Para Term Preterm Abortions TAB SAB Ect Mult Living   7 5 4 1 1  1   5      Social History: History   Social History  . Marital Status: Single    Spouse Name: N/A    Number of Children: N/A  . Years of Education: N/A   Social History Main Topics  . Smoking status: Former Smoker -- 0.50 packs/day    Types: Cigarettes    Quit date: 06/07/2012  . Smokeless tobacco: Never Used     Comment: Patient stopped smoking when she found out she was pregnant  . Alcohol Use: No  . Drug Use: No  . Sexually Active: Yes    Birth Control/ Protection: None   Other Topics Concern  . Not on file   Social History Narrative  . No narrative on file    Family History: Family History  Problem Relation Age of Onset  . Other Neg Hx   . Diabetes Paternal Grandmother   . Congenital heart disease Son   . Hypertension Mother     Allergies: No Known Allergies  Prescriptions prior to admission  Medication Sig Dispense Refill  . acetaminophen (TYLENOL) 325 MG tablet Take 325 mg by mouth every 6 (six) hours as needed for pain.      . Prenatal Vit-Fe Fumarate-FA (PRENATAL MULTIVITAMIN) TABS Take 1 tablet  by mouth daily at 12 noon.         Review of Systems   Constitutional: Negative for fever, chills, weight loss, malaise/fatigue and diaphoresis.  HENT: Negative for hearing loss, ear pain, nosebleeds, congestion, sore throat, neck pain, tinnitus and ear discharge.   Eyes: Negative for blurred vision, double vision, photophobia, pain, discharge and redness.  Respiratory: Negative for cough, hemoptysis, sputum production, shortness of breath, wheezing and stridor.   Cardiovascular: Negative for chest pain, palpitations, orthopnea,  leg swelling  Gastrointestinal: Positive for abdominal pain. Negative for heartburn, nausea, vomiting, diarrhea, constipation, blood in stool Genitourinary: Negative for dysuria, urgency, frequency, hematuria and flank pain.  Musculoskeletal: Negative for myalgias, back pain, joint pain and falls.  Skin: Negative for itching and rash.  Neurological: Negative for dizziness, tingling, tremors, sensory change, speech change, focal weakness, seizures, loss of consciousness, weakness and headaches.  Endo/Heme/Allergies: Negative for environmental allergies and polydipsia. Does not bruise/bleed easily.  Psychiatric/Behavioral: Negative for depression, suicidal ideas, hallucinations, memory loss and substance abuse. The patient is not nervous/anxious and does not have insomnia.       Blood pressure 130/89, pulse 79, temperature 97.8 F (36.6 C), temperature source Oral, resp. rate 18, height 5\' 3"  (1.6  m), weight 82.555 kg (182 lb), last menstrual period 03/14/2012. General appearance: alert, cooperative and no distress Lungs: clear to auscultation bilaterally Heart: regular rate and rhythm Abdomen: soft, non-tender; bowel sounds normal Pelvic: adequate.  Extremities: Homans sign is negative, no sign of DVT DTR's 2+ Presentation: cephalic Fetal monitoringBaseline: 133 bpm, Variability: Good {> 6 bpm), Accelerations: Reactive and Decelerations: Absent Uterine  activity-irritability  Dilation: 1.5 Effacement (%): 50 Station: -3 Exam by:: Dr Reola Calkins   Prenatal labs: ABO, Rh: --/--/O POS (08/10 0745) Antibody: NEG (08/10 0745) Rubella:   RPR: NON REAC (05/08 1222)  HBsAg: Negative (01/15 0000)  HIV: Non-reactive (01/15 0000)  GBS: Negative (07/10 0000)  1 hr Glucola 129 Genetic screening  Normal [facility] An[facility] US normal  .resultrcnt[24h  Assessment: Alicia [name] is a 37 y.o. [id number] with an IUP at [redacted]w[redacted]d presenting for IOL for post dates after previous c/s for NRFHT.   Plan: 1) TOLAC (consent signed)  - unfavorable bishops. Foley bulb placed and filled with 60cc of LR. Pt tolerated well.  - will wait 4 hours and then start pitocin per protocal - admit to L&D - routine orders  2) elevated BP - upon admission. No hx this pregnancy - will send PIH labs for baseline  3) FWB - cat I tracing - GBS neg - EFW 7#  3) anticipate SVD   Fannie Gathright L, MD 12/26/2012, 9:47 AM

## 2012-12-26 NOTE — Progress Notes (Signed)
Alicia [name] is a 37 y.o. [id number] at [redacted]w[redacted]d  admitted for induction of labor due to Post dates. Due date 12/19/12.  Subjective:  Pt doing well. Starting in the last hour to hurt more with contractions.  Wanting something for pain.   Objective: BP 143/101  Pulse 93  Temp(Src) 97.8 F (36.6 C) (Oral)  Resp 18  Ht 5\' 3"  (1.6 m)  Wt 82.555 kg (182 lb)  BMI 32.25 kg/m2  LMP 03/14/2012      FHT:  FHR: 125 bpm, variability: moderate,  accelerations:  Present,  decelerations:  Absent UC:   irregular, every 2-6 minutes SVE:   Dilation: 1.5 Effacement (%): 50 Station: -3 Exam by:: [employer] RN  Labs: Lab Results  Component Value Date   WBC 7.4 12/26/2012   HGB 11.4* 12/26/2012   HCT 34.7* 12/26/2012   MCV 82.6 12/26/2012   PLT 191 12/26/2012    Assessment / Plan: IOL for post dates. TOLAC   Labor: FB in place. Pit at 28mu/min. not increasing at this time.  Elevated BP: waiting spot prot/cr ratio. No severe range Fetal Wellbeing:  Category I Pain Control:  Fentanyl I/D:  n/a Anticipated MOD:  NSVD  Alicia [name] 12/26/2012, 7:08 PM

## 2012-12-27 ENCOUNTER — Encounter: Payer: Self-pay | Admitting: *Deleted

## 2012-12-27 ENCOUNTER — Inpatient Hospital Stay (HOSPITAL_COMMUNITY): Payer: Medicaid Other | Admitting: Anesthesiology

## 2012-12-27 ENCOUNTER — Encounter (HOSPITAL_COMMUNITY): Payer: Self-pay

## 2012-12-27 ENCOUNTER — Encounter (HOSPITAL_COMMUNITY): Payer: Self-pay | Admitting: Anesthesiology

## 2012-12-27 DIAGNOSIS — O34219 Maternal care for unspecified type scar from previous cesarean delivery: Secondary | ICD-10-CM

## 2012-12-27 DIAGNOSIS — O480 Post-term pregnancy: Secondary | ICD-10-CM

## 2012-12-27 DIAGNOSIS — O09529 Supervision of elderly multigravida, unspecified trimester: Secondary | ICD-10-CM

## 2012-12-27 MED ORDER — FENTANYL 2.5 MCG/ML BUPIVACAINE 1/10 % EPIDURAL INFUSION (WH - ANES)
INTRAMUSCULAR | Status: DC | PRN
  Administered 2012-12-27: 14 mL/h via EPIDURAL

## 2012-12-27 MED ORDER — LIDOCAINE HCL (PF) 1 % IJ SOLN
INTRAMUSCULAR | Status: DC | PRN
  Administered 2012-12-27 (×2): 4 mL

## 2012-12-27 MED ORDER — OXYTOCIN 40 UNITS IN LACTATED RINGERS INFUSION - SIMPLE MED
1.0000 m[IU]/min | INTRAVENOUS | Status: DC
  Administered 2012-12-27: 10 m[IU]/min via INTRAVENOUS
  Administered 2012-12-27: 16 m[IU]/min via INTRAVENOUS
  Administered 2012-12-27: 14 m[IU]/min via INTRAVENOUS
  Administered 2012-12-27: 12 m[IU]/min via INTRAVENOUS
  Filled 2012-12-27: qty 1000

## 2012-12-27 NOTE — Progress Notes (Signed)
Alicia [name] is a 37 y.o. [id number] at [redacted]w[redacted]d admitted for induction of labor due to Post dates. Due date 12/19/12.  Subjective:  Pt is doing well now.  Had a pit break for the last 3 hours due to no change in hours.  Now ready to start again.   Objective: BP 130/81  Pulse 84  Temp(Src) 97.7 F (36.5 C) (Oral)  Resp 18  Ht 5\' 3"  (1.6 m)  Wt 82.555 kg (182 lb)  BMI 32.25 kg/m2  LMP 03/14/2012      FHT:  FHR: 130 bpm, variability: moderate,  accelerations:  Present,  decelerations:  Absent UC:   occasional SVE:   Dilation: 4 Effacement (%): 40 Station: -3 Exam by:: Dr. Ike Bene  Labs: Lab Results  Component Value Date   WBC 7.4 12/26/2012   HGB 11.4* 12/26/2012   HCT 34.7* 12/26/2012   MCV 82.6 12/26/2012   PLT 191 12/26/2012    Assessment / Plan: IOL for post dates and TOLAC. S/p foley bulb and pit. Now s/p pit rest.  Ready to restart.   Labor: restart pit. if good pattern develops and head engages better, will consider AROM. Fetal Wellbeing:  Category I Pain Control:  Labor support without medications I/D:  n/a Anticipated MOD:  NSVD  Alicia [name] 12/27/2012, 1:21 PM

## 2012-12-27 NOTE — Progress Notes (Signed)
Alicia [name] is a 37 y.o. [id number] at [redacted]w[redacted]d by ultrasound admitted for induction of labor due to Post dates. Due date 12/19/12.  Subjective: Foley bulb expelled at 23:30, Pitocin at 20U, Pt feeling some ctx  Objective: BP 114/83  Pulse 80  Temp(Src) 97.7 F (36.5 C) (Oral)  Resp 18  Ht 5\' 3"  (1.6 m)  Wt 82.555 kg (182 lb)  BMI 32.25 kg/m2  LMP 03/14/2012      Filed Vitals:   12/26/12 2219 12/26/12 2354 12/27/12 0043 12/27/12 0338  BP: 128/74 117/72 131/75 114/83  Pulse: 77 73 79 80  Temp: 98.4 F (36.9 C)   97.7 F (36.5 C)  TempSrc: Oral   Oral  Resp: 18 18 18 18   Height:      Weight:         FHT:  FHR: 130s bpm, variability: moderate,  accelerations:  Present,  decelerations:  Absent UC:   irregularly q3-24m feels mildly more painful SVE:    Dilation: 4 Effacement (%): 40 Station: -3 Exam by:: Dr. Ike Bene  Labs: Lab Results  Component Value Date   WBC 7.4 12/26/2012   HGB 11.4* 12/26/2012   HCT 34.7* 12/26/2012   MCV 82.6 12/26/2012   PLT 191 12/26/2012    Assessment / Plan: Induction of labor due to postterm,  progressing well on pitocin  Labor: slow progress, latent phase will defer to day team to consider pit rest. Preeclampsia:  No evidence at this time. Fetal Wellbeing:  Category I Pain Control:  Labor support without medications, may readdress when painful I/D:  n/a Anticipated MOD:  NSVD  Alicia [name], RYAN 12/27/2012, 6:45 AM

## 2012-12-27 NOTE — Progress Notes (Signed)
Pts mother to desk to tell RN that patient has had enough and is ready to be reconnected to everything.  In pt room, pt has just finished eating.  Explained the benefit of waiting until food digest to restart Pitocin.  Notified pt that I would discuss woth MD

## 2012-12-27 NOTE — Progress Notes (Signed)
Spoke to pts mother and explained the benefit of delaying pitocin start after eating.  Also explained the risks of uterine rupture with aggressive induction.  Notified family that MD has been notified of request and will follow up when out of OR.  Also notified pts mother of the likelihood of a late day/ enening delivery

## 2012-12-27 NOTE — Anesthesia Preprocedure Evaluation (Signed)
Anesthesia Evaluation    Airway Mallampati: III TM Distance: >3 FB Neck ROM: Full    Dental no notable dental hx. (+) Teeth Intact   Pulmonary former smoker,  breath sounds clear to auscultation  Pulmonary exam normal       Cardiovascular negative cardio ROS  Rhythm:Regular Rate:Normal     Neuro/Psych negative neurological ROS  negative psych ROS   GI/Hepatic negative GI ROS, Neg liver ROS,   Endo/Other  negative endocrine ROS  Renal/GU negative Renal ROS  negative genitourinary   Musculoskeletal negative musculoskeletal ROS (+)   Abdominal (+) + obese,   Peds  Hematology negative hematology ROS (+)   Anesthesia Other Findings   Reproductive/Obstetrics AMA Desires sterilization Multiparity Previous C/Section  Last pregnancy NRFHR                           Anesthesia Physical Anesthesia Plan  ASA: II  Anesthesia Plan: Epidural   Post-op Pain Management:    Induction:   Airway Management Planned: Natural Airway  Additional Equipment:   Intra-op Plan:   Post-operative Plan:   Informed Consent: I have reviewed the patients History and Physical, chart, labs and discussed the procedure including the risks, benefits and alternatives for the proposed anesthesia with the patient or authorized representative who has indicated his/her understanding and acceptance.   Dental advisory given  Plan Discussed with: Anesthesiologist, CRNA and Surgeon  Anesthesia Plan Comments:         Anesthesia Quick Evaluation

## 2012-12-27 NOTE — Progress Notes (Signed)
Alicia [name] is a 37 y.o. [id number] at [redacted]w[redacted]d by ultrasound admitted for induction of labor due to Post dates. Due date 12/19/12.  Subjective: Foley bulb expelled at 23:30   Objective: BP 117/72  Pulse 73  Temp(Src) 98.4 F (36.9 C) (Oral)  Resp 18  Ht 5\' 3"  (1.6 m)  Wt 82.555 kg (182 lb)  BMI 32.25 kg/m2  LMP 03/14/2012      FHT:  FHR: 150 bpm, variability: moderate,  accelerations:  Present,  decelerations:  Absent UC:   irregular, every 8 minutes SVE:   Dilation: 3.5 Effacement (%): 60 Station: -3 Exam by:: Jonette, RN  Labs: Lab Results  Component Value Date   WBC 7.4 12/26/2012   HGB 11.4* 12/26/2012   HCT 34.7* 12/26/2012   MCV 82.6 12/26/2012   PLT 191 12/26/2012    Assessment / Plan: Induction of labor due to postterm,  progressing well on pitocin  Labor: slow progress, latent phase Preeclampsia:   Fetal Wellbeing:  Category I Pain Control:  Labor support without medications I/D:  n/a Anticipated MOD:  NSVD  Artin Mceuen V 12/27/2012, 12:24 AM

## 2012-12-27 NOTE — Progress Notes (Signed)
Alicia [name] is a 37 y.o. [id number] at [redacted]w[redacted]d by ultrasound admitted for induction of labor due to Post dates. Due date 12/19/12.  Subjective: Foley bulb expelled at 23:30, pt sleeping comfortably. Pitocin at 20U  Objective: BP 114/83  Pulse 80  Temp(Src) 97.7 F (36.5 C) (Oral)  Resp 18  Ht 5\' 3"  (1.6 m)  Wt 82.555 kg (182 lb)  BMI 32.25 kg/m2  LMP 03/14/2012      FHT:  FHR: 130s bpm, variability: moderate,  accelerations:  Present,  decelerations:  Absent UC:   Regularly q3-60m non painful SVE:  Not repeated since 2330  Dilation: 3.5 Effacement (%): 60 Station: -3 Exam by:: Jonette, RN  Labs: Lab Results  Component Value Date   WBC 7.4 12/26/2012   HGB 11.4* 12/26/2012   HCT 34.7* 12/26/2012   MCV 82.6 12/26/2012   PLT 191 12/26/2012    Assessment / Plan: Induction of labor due to postterm,  progressing well on pitocin  Labor: slow progress, latent phase Preeclampsia:  No evidence at this time. Fetal Wellbeing:  Category I Pain Control:  Labor support without medications, may readdress when painful I/D:  n/a Anticipated MOD:  NSVD  Alicia [name], Alicia [name] 12/27/2012, 4:30 AM

## 2012-12-27 NOTE — Progress Notes (Signed)
Patient sleeping, no complaints.

## 2012-12-27 NOTE — Anesthesia Procedure Notes (Signed)
Epidural Patient location during procedure: OB Start time: 12/27/2012 11:36 PM  Staffing Anesthesiologist: Chalmers Iddings A.  Preanesthetic Checklist Completed: patient identified, site marked, surgical consent, pre-op evaluation, timeout performed, IV checked, risks and benefits discussed and monitors and equipment checked  Epidural Patient position: sitting Prep: site prepped and draped and DuraPrep Patient monitoring: continuous pulse ox and blood pressure Approach: midline Injection technique: LOR air  Needle:  Needle type: Tuohy  Needle gauge: 17 G Needle length: 9 cm and 9 Needle insertion depth: 4 cm Catheter type: closed end flexible Catheter size: 19 Gauge Catheter at skin depth: 9 cm Test dose: negative and Other  Assessment Events: blood not aspirated, injection not painful, no injection resistance, negative IV test and no paresthesia  Additional Notes Patient identified. Risks and benefits discussed including failed block, incomplete  Pain control, post dural puncture headache, nerve damage, paralysis, blood pressure Changes, nausea, vomiting, reactions to medications-both toxic and allergic and post Partum back pain. All questions were answered. Patient expressed understanding and wished to proceed. Sterile technique was used throughout procedure. Epidural site was Dressed with sterile barrier dressing. No paresthesias, signs of intravascular injection Or signs of intrathecal spread were encountered.  Patient was more comfortable after the epidural was dosed. Please see RN's note for documentation of vital signs and FHR which are stable.

## 2012-12-28 ENCOUNTER — Encounter (HOSPITAL_COMMUNITY): Payer: Self-pay

## 2012-12-28 MED ORDER — ZOLPIDEM TARTRATE 5 MG PO TABS: 5.0000 mg | ORAL_TABLET | Freq: Every evening | ORAL | Status: DC | PRN

## 2012-12-28 MED ORDER — SIMETHICONE 80 MG PO CHEW: 80.0000 mg | CHEWABLE_TABLET | ORAL | Status: DC | PRN

## 2012-12-28 MED ORDER — SENNOSIDES-DOCUSATE SODIUM 8.6-50 MG PO TABS
2.0000 | ORAL_TABLET | Freq: Every day | ORAL | Status: DC
  Administered 2012-12-28 – 2012-12-29 (×2): 2 via ORAL

## 2012-12-28 MED ORDER — PRENATAL MULTIVITAMIN CH
1.0000 | ORAL_TABLET | Freq: Every day | ORAL | Status: DC
  Administered 2012-12-28 – 2012-12-30 (×3): 1 via ORAL
  Filled 2012-12-28 (×3): qty 1

## 2012-12-28 MED ORDER — WITCH HAZEL-GLYCERIN EX PADS: 1.0000 "application " | MEDICATED_PAD | CUTANEOUS | Status: DC | PRN

## 2012-12-28 MED ORDER — BENZOCAINE-MENTHOL 20-0.5 % EX AERO: 1.0000 "application " | INHALATION_SPRAY | CUTANEOUS | Status: DC | PRN

## 2012-12-28 MED ORDER — IBUPROFEN 600 MG PO TABS
600.0000 mg | ORAL_TABLET | Freq: Four times a day (QID) | ORAL | Status: DC
  Administered 2012-12-28 – 2012-12-30 (×9): 600 mg via ORAL
  Filled 2012-12-28 (×10): qty 1

## 2012-12-28 MED ORDER — DIPHENHYDRAMINE HCL 25 MG PO CAPS
25.0000 mg | ORAL_CAPSULE | Freq: Four times a day (QID) | ORAL | Status: DC | PRN
  Administered 2012-12-28 (×2): 25 mg via ORAL
  Filled 2012-12-28 (×2): qty 1

## 2012-12-28 MED ORDER — ONDANSETRON HCL 4 MG PO TABS: 4.0000 mg | ORAL_TABLET | ORAL | Status: DC | PRN

## 2012-12-28 MED ORDER — OXYCODONE-ACETAMINOPHEN 5-325 MG PO TABS
1.0000 | ORAL_TABLET | ORAL | Status: DC | PRN
  Administered 2012-12-28 – 2012-12-29 (×2): 1 via ORAL
  Filled 2012-12-28 (×2): qty 1

## 2012-12-28 MED ORDER — TETANUS-DIPHTH-ACELL PERTUSSIS 5-2.5-18.5 LF-MCG/0.5 IM SUSP: 0.5000 mL | Freq: Once | INTRAMUSCULAR | Status: DC

## 2012-12-28 MED ORDER — LANOLIN HYDROUS EX OINT: TOPICAL_OINTMENT | CUTANEOUS | Status: DC | PRN

## 2012-12-28 MED ORDER — DIBUCAINE 1 % RE OINT: 1.0000 "application " | TOPICAL_OINTMENT | RECTAL | Status: DC | PRN

## 2012-12-28 MED ORDER — ONDANSETRON HCL 4 MG/2ML IJ SOLN: 4.0000 mg | INTRAMUSCULAR | Status: DC | PRN

## 2012-12-28 NOTE — Progress Notes (Signed)
Alicia [name] is a 37 y.o. [id number] at [redacted]w[redacted]d admitted for induction of labor due to Post dates. Due date 12/19/2012.  Subjective: Patient comfortable with epidural.  Objective: BP 121/91  Pulse 75  Temp(Src) 97.8 F (36.6 C) (Oral)  Resp 18  Ht 5\' 3"  (1.6 m)  Wt 82.555 kg (182 lb)  BMI 32.25 kg/m2  LMP 03/14/2012      FHT:  FHR: 130 bpm, variability: moderate,  accelerations:  Present,  decelerations:  Absent UC:   More regular in the last hour, q72min SVE:   Dilation: 5 Effacement (%): 90 Station: 0 Exam by:: Dr Reola Calkins  Labs: Lab Results  Component Value Date   WBC 7.4 12/26/2012   HGB 11.4* 12/26/2012   HCT 34.7* 12/26/2012   MCV 82.6 12/26/2012   PLT 191 12/26/2012    Assessment / Plan: Induction of labor due to postterm,  progressing on pitocin 43mu/hr AROM around 12:05, clear fluid. Head moved down and cervix stretched to 7cm  Labor: Progressing on Pitocin Fetal Wellbeing:  Category I Pain Control:  Epidural I/D:  n/a Anticipated MOD:  NSVD  Tawni Carnes 12/28/2012, 12:13 AM

## 2012-12-28 NOTE — Progress Notes (Signed)
UR chart review completed.  

## 2012-12-28 NOTE — Anesthesia Postprocedure Evaluation (Signed)
  Anesthesia Post-op Note  Patient: Alicia [name]  Procedure(s) Performed: * No procedures listed *  Patient Location: PACU and Mother/Baby  Anesthesia Type:Epidural  Level of Consciousness: awake, alert  and oriented  Airway and Oxygen Therapy: Patient Spontanous Breathing  Post-op Pain: none  Post-op Assessment: Post-op Vital signs reviewed, Patient's Cardiovascular Status Stable, No headache, No backache, No residual numbness and No residual motor weakness  Post-op Vital Signs: Reviewed and stable  Complications: No apparent anesthesia complications

## 2012-12-29 NOTE — Plan of Care (Signed)
Problem: Discharge Progression Outcomes Goal: MMR given as ordered Outcome: Not Met (add Reason) Needs MMR ( non immune)     

## 2012-12-29 NOTE — Discharge Summary (Signed)
Obstetric Discharge Summary Reason for Admission: induction of labor for postdates Prenatal Procedures: NST Intrapartum Procedures: spontaneous vaginal delivery- successful VBAC Postpartum Procedures: none Complications-Operative and Postpartum: 2nd degree vaginal laceration Hemoglobin  Date Value Range Status  12/26/2012 11.4* 12.0 - 15.0 g/dL Final     HCT  Date Value Range Status  12/26/2012 34.7* 36.0 - 46.0 % Final    Physical Exam:  General: alert, cooperative and appears stated age Heart: RRR Lungs: nl effort Lochia: appropriate Uterine Fundus: firm DVT Evaluation: No evidence of DVT seen on physical exam. Negative Homan's sign. No cords or calf tenderness.  Discharge Diagnoses: Term Pregnancy-delivered- VBAC  Discharge Information: Date: 12/30/12 Activity: pelvic rest Diet: routine Medications: Ibuprofen Condition: stable Instructions: refer to practice specific booklet Discharge to: home   Newborn Data: Live born female  Birth Weight: 7 lb 1 oz (3204 g) APGAR: 8, 9  Home with mother. Breast and bottlefeeding; desires Depo for contraception.  CLARK, MICHAEL L 12/29/2012, 7:25 AM  I have seen and examined this patient and I agree with the above. Exam updated for 12/30/12. Cam Hai 7:43 AM 12/30/2012

## 2012-12-29 NOTE — Progress Notes (Signed)
Post Partum Day 1 Subjective: no complaints, up ad lib, voiding, tolerating PO and + flatus  Objective: Blood pressure 146/89, pulse 69, temperature 97.5 F (36.4 C), temperature source Oral, resp. rate 18, height 5\' 3"  (1.6 m), weight 82.555 kg (182 lb), last menstrual period 03/14/2012, SpO2 99.00%, unknown if currently breastfeeding.  Physical Exam:  General: alert, cooperative and no distress Lochia: appropriate Uterine Fundus: firm Incision: na DVT Evaluation: No evidence of DVT seen on physical exam.  No results found for this basename: HGB, HCT,  in the last 72 hours  Assessment/Plan: Plan for discharge tomorrow, Breastfeeding, Lactation consult and Contraception depo   LOS: 3 days   Alicia [name] 12/29/2012, 11:42 AM

## 2012-12-29 NOTE — Lactation Note (Signed)
This note was copied from the chart of Alicia [name]. Lactation Consultation Note    Brief consult with this mom  And baby. Mom on phone, baby asleep in crib. Mom reports she would like help with latching her baby to her right breast, but does not want to wake the baby now, and she just fed formula. I told mom to call the desk for lactation help when the baby starts to wake.  Patient Name: Alicia [name] Date: 12/29/2012 Reason for consult: Follow-up assessment   Maternal Data    Feeding    LATCH Score/Interventions                      Lactation Tools Discussed/Used     Consult Status Consult Status: PRN    Alfred Levins 12/29/2012, 4:11 PM

## 2012-12-30 ENCOUNTER — Encounter: Payer: Self-pay | Admitting: Advanced Practice Midwife

## 2012-12-30 MED ORDER — PNEUMOCOCCAL VAC POLYVALENT 25 MCG/0.5ML IJ INJ
0.5000 mL | INJECTION | Freq: Once | INTRAMUSCULAR | Status: AC
  Administered 2012-12-30: 0.5 mL via INTRAMUSCULAR
  Filled 2012-12-30: qty 0.5

## 2012-12-30 MED ORDER — CONCEPT DHA 53.5-38-1 MG PO CAPS: 1.0000 | ORAL_CAPSULE | Freq: Every day | ORAL | Status: DC

## 2012-12-30 MED ORDER — IBUPROFEN 600 MG PO TABS: 600.0000 mg | ORAL_TABLET | Freq: Four times a day (QID) | ORAL | Status: DC

## 2012-12-30 MED ORDER — MEASLES, MUMPS & RUBELLA VAC ~~LOC~~ INJ
0.5000 mL | INJECTION | Freq: Once | SUBCUTANEOUS | Status: AC
  Administered 2012-12-30: 0.5 mL via SUBCUTANEOUS
  Filled 2012-12-30: qty 0.5

## 2012-12-30 NOTE — Lactation Note (Signed)
This note was copied from the chart of Alicia Taylen Wendland. Lactation Consultation Note  Patient Name: Alicia [name] [id number] Date: 12/30/2012 Reason for consult: Follow-up assessment   Maternal Data    Feeding Feeding Type: Breast Milk  LATCH Score/Interventions     Lactation Tools Discussed/Used     Consult Status Consult Status: Complete  Follow up visit with mom- she is on the phone and reports that BF is going well and she has no questions. To call prn  Pamelia Hoit 12/30/2012, 10:14 AM

## 2013-02-09 ENCOUNTER — Ambulatory Visit (INDEPENDENT_AMBULATORY_CARE_PROVIDER_SITE_OTHER): Payer: Medicaid Other | Admitting: Advanced Practice Midwife

## 2013-02-09 ENCOUNTER — Other Ambulatory Visit (HOSPITAL_COMMUNITY)
Admission: RE | Admit: 2013-02-09 | Discharge: 2013-02-09 | Disposition: A | Discharge status: Home / Self Care | Payer: Medicaid Other | Source: Ambulatory Visit | Attending: Advanced Practice Midwife | Admitting: Advanced Practice Midwife

## 2013-02-09 ENCOUNTER — Encounter: Payer: Self-pay | Admitting: Advanced Practice Midwife

## 2013-02-09 VITALS — BP 119/73 | HR 71 | Temp 98.1°F | Ht 63.0 in | Wt 161.0 lb

## 2013-02-09 DIAGNOSIS — Z3042 Encounter for surveillance of injectable contraceptive: Secondary | ICD-10-CM

## 2013-02-09 DIAGNOSIS — O34219 Maternal care for unspecified type scar from previous cesarean delivery: Secondary | ICD-10-CM

## 2013-02-09 DIAGNOSIS — Z3049 Encounter for surveillance of other contraceptives: Secondary | ICD-10-CM

## 2013-02-09 DIAGNOSIS — Z1151 Encounter for screening for human papillomavirus (HPV): Secondary | ICD-10-CM | POA: Insufficient documentation

## 2013-02-09 DIAGNOSIS — Z01419 Encounter for gynecological examination (general) (routine) without abnormal findings: Secondary | ICD-10-CM | POA: Insufficient documentation

## 2013-02-09 MED ORDER — MEDROXYPROGESTERONE ACETATE 150 MG/ML IM SUSP
150.0000 mg | INTRAMUSCULAR | Status: AC
  Administered 2013-02-09: 150 mg via INTRAMUSCULAR

## 2013-02-09 MED ORDER — MEDROXYPROGESTERONE ACETATE 150 MG/ML IM SUSP: 150.0000 mg | INTRAMUSCULAR | Status: DC

## 2013-02-09 NOTE — Progress Notes (Signed)
  Subjective:     Alicia [name] is a 37 y.o. female who presents for a postpartum visit. She is 6 weeks postpartum following a VBAC.  I have fully reviewed the prenatal and intrapartum course. The delivery was at term. Outcome: vaginal birth after cesarean (VBAC). Anesthesia: regional. Postpartum course has been uneventful. Baby's course has been uneventful. Baby is feeding by breast. Bleeding no bleeding. Bowel function is normal. Bladder function is normal. Patient is not sexually active. Contraception method is Depo-Provera injections. Postpartum depression screening: negative, score = 0  The following portions of the patient's history were reviewed and updated as appropriate: allergies, current medications, past family history, past medical history, past social history, past surgical history and problem list.  Review of Systems Pertinent items are noted in HPI.   Objective:    BP 119/73  Pulse 71  Temp(Src) 98.1 F (36.7 C) (Oral)  Ht 5\' 3"  (1.6 m)  Wt 73.029 kg (161 lb)  BMI 28.53 kg/m2  General:  alert, cooperative and no distress   Breasts:  inspection negative, no nipple discharge or bleeding, no masses or nodularity palpable  Lungs: n/a  Heart:  n/a  Abdomen: soft, non-tender; bowel sounds normal; no masses,  no organomegaly   Vulva:  normal  Vagina: normal vagina, no discharge, exudate, lesion, or erythema  Cervix:  multiparous appearance  Corpus: normal size, contour, position, consistency, mobility, non-tender  Adnexa:  normal adnexa and no mass, fullness, tenderness  Rectal Exam: Not performed.        Assessment:     Normal postpartum exam. Pap smear done at today's visit.   Plan:    1. Contraception: Depo-Provera injections 2.  Pap done today. 3. Follow up in: 1 year or as needed.

## 2013-02-09 NOTE — Addendum Note (Signed)
Addended by: Sherre Lain A on: 02/09/2013 04:58 PM   Modules accepted: Orders

## 2013-02-09 NOTE — Patient Instructions (Addendum)

## 2013-02-15 ENCOUNTER — Encounter: Payer: Self-pay | Admitting: Advanced Practice Midwife

## 2013-04-07 ENCOUNTER — Encounter (HOSPITAL_COMMUNITY): Payer: Self-pay | Admitting: Emergency Medicine

## 2013-04-07 ENCOUNTER — Emergency Department (HOSPITAL_COMMUNITY)
Admission: EM | Admit: 2013-04-07 | Discharge: 2013-04-07 | Disposition: A | Discharge status: Home / Self Care | Payer: Medicaid Other | Attending: Emergency Medicine | Admitting: Emergency Medicine

## 2013-04-07 DIAGNOSIS — Z87891 Personal history of nicotine dependence: Secondary | ICD-10-CM | POA: Insufficient documentation

## 2013-04-07 DIAGNOSIS — R109 Unspecified abdominal pain: Secondary | ICD-10-CM | POA: Insufficient documentation

## 2013-04-07 DIAGNOSIS — R197 Diarrhea, unspecified: Secondary | ICD-10-CM

## 2013-04-07 DIAGNOSIS — Z862 Personal history of diseases of the blood and blood-forming organs and certain disorders involving the immune mechanism: Secondary | ICD-10-CM | POA: Insufficient documentation

## 2013-04-07 DIAGNOSIS — Z3202 Encounter for pregnancy test, result negative: Secondary | ICD-10-CM | POA: Insufficient documentation

## 2013-04-07 DIAGNOSIS — R110 Nausea: Secondary | ICD-10-CM | POA: Insufficient documentation

## 2013-04-07 MED ORDER — IBUPROFEN 800 MG PO TABS
800.0000 mg | ORAL_TABLET | Freq: Once | ORAL | Status: AC
  Administered 2013-04-07: 800 mg via ORAL
  Filled 2013-04-07: qty 1

## 2013-04-07 MED ORDER — ONDANSETRON HCL 4 MG PO TABS: 4.0000 mg | ORAL_TABLET | Freq: Three times a day (TID) | ORAL | Status: DC | PRN

## 2013-04-07 MED ORDER — ONDANSETRON 4 MG PO TBDP
4.0000 mg | ORAL_TABLET | Freq: Once | ORAL | Status: AC
  Administered 2013-04-07: 4 mg via ORAL
  Filled 2013-04-07: qty 1

## 2013-04-07 NOTE — ED Notes (Signed)
Per pt sts abdominal pain and diarrhea since she woke up this am. Denies blood in stool. Denies eating anything bad. sts she has went to the bathroom 4 times.

## 2013-04-07 NOTE — ED Provider Notes (Signed)
CSN: [id number]     Arrival date & time 04/07/13  1346 History   First MD Initiated Contact with Patient 04/07/13 1354     Chief Complaint  Patient presents with  . Abdominal Pain   (Consider location/radiation/quality/duration/timing/severity/associated sxs/prior Treatment) HPI Comments: 37 year old female presents with crampy abdominal pain that started about 10 hours ago. She states she's also had 4 or 5 loose stools. No mucus or blood. Has not had any recent travel, antibiotics, or sick contacts. Has felt nauseous but has not vomited. She points to her umbilicus as the main area of pain. This pain usually starts having a slow but better when she has a bowel movement but recently to be getting a little but worse. Denies any fevers or chills. Denies any urinary symptoms. She took one Tylenol pill but this did not help her pain.   Past Medical History  Diagnosis Date  . No pertinent past medical history   . Anemia    Past Surgical History  Procedure Laterality Date  . Foot surgery    . Cesarean section    . Incision and drainage intra oral abscess Right 07/06/2012   Family History  Problem Relation Age of Onset  . Other Neg Hx   . Diabetes Paternal Grandmother   . Congenital heart disease Son   . Hypertension Mother    History  Substance Use Topics  . Smoking status: Former Smoker -- 0.50 packs/day    Types: Cigarettes    Quit date: 06/07/2012  . Smokeless tobacco: Never Used     Comment: Patient stopped smoking when she found out she was pregnant  . Alcohol Use: No   OB History   Grav Para Term Preterm Abortions TAB SAB Ect Mult Living   7 6 5 1 1  1   6      Review of Systems  Constitutional: Negative for fever and chills.  Respiratory: Negative for shortness of breath.   Cardiovascular: Negative for chest pain.  Gastrointestinal: Positive for nausea, abdominal pain and diarrhea. Negative for vomiting, constipation and blood in stool.  Genitourinary: Negative for  dysuria and menstrual problem.  All other systems reviewed and are negative.    Allergies  Review of patient's allergies indicates no known allergies.  Home Medications   Current Outpatient Rx  Name  Route  Sig  Dispense  Refill  . acetaminophen (TYLENOL) 325 MG tablet   Oral   Take 325 mg by mouth every 6 (six) hours as needed for pain.         . Prenatal Vit-Fe Fumarate-FA (PRENATAL MULTIVITAMIN) TABS   Oral   Take 1 tablet by mouth daily at 12 noon.          BP 142/106  Pulse 95  Temp(Src) 98.6 F (37 C) (Oral)  Resp 16  Wt 163 lb 6.4 oz (74.118 kg)  SpO2 99%  LMP 03/02/2013 Physical Exam  Nursing note and vitals reviewed. Constitutional: She is oriented to person, place, and time. She appears well-developed and well-nourished. No distress.  HENT:  Head: Normocephalic and atraumatic.  Right Ear: External ear normal.  Left Ear: External ear normal.  Nose: Nose normal.  Eyes: Right eye exhibits no discharge. Left eye exhibits no discharge.  Cardiovascular: Normal rate, regular rhythm and normal heart sounds.   Pulmonary/Chest: Effort normal and breath sounds normal.  Abdominal: Soft. She exhibits no distension and no mass. There is no tenderness. There is no rebound and no guarding.  Neurological: She is  alert and oriented to person, place, and time.  Skin: Skin is warm and dry.    ED Course  Procedures (including critical care time) Labs Review Labs Reviewed  POCT PREGNANCY, URINE   Imaging Review No results found.  EKG Interpretation   None       MDM   1. Acute diarrhea   2. Abdominal pain    Patient symptoms and exam are consistent with an acute diarrheal illness. She has a benign abdominal exam, no focal tenderness, especially in the RLQ. No urinary symptoms. She's not pregnant. We'll treat with Zofran for her nausea, and Tylenol and Motrin for her pain. She's not high risk for a bacterial illness. Discussed strict return precautions. At this  point her vitals are normal and there are no signs of dehydration, will encourage oral rehydration.    Audree Camel, MD 04/07/13 1435

## 2013-04-27 ENCOUNTER — Ambulatory Visit: Payer: Medicaid Other

## 2013-06-27 ENCOUNTER — Telehealth: Payer: Self-pay | Admitting: Obstetrics & Gynecology

## 2013-06-27 ENCOUNTER — Ambulatory Visit: Payer: Medicaid Other

## 2013-06-27 NOTE — Telephone Encounter (Signed)
Rescheuled.

## 2013-07-14 ENCOUNTER — Ambulatory Visit: Payer: Medicaid Other | Admitting: Nurse Practitioner

## 2013-07-22 ENCOUNTER — Encounter: Payer: Self-pay | Admitting: Medical

## 2013-07-22 ENCOUNTER — Ambulatory Visit (INDEPENDENT_AMBULATORY_CARE_PROVIDER_SITE_OTHER): Payer: Medicaid Other | Admitting: Medical

## 2013-07-22 VITALS — BP 124/93 | HR 71 | Temp 97.5°F | Ht 63.0 in | Wt 161.2 lb

## 2013-07-22 DIAGNOSIS — N898 Other specified noninflammatory disorders of vagina: Secondary | ICD-10-CM

## 2013-07-22 DIAGNOSIS — Z3049 Encounter for surveillance of other contraceptives: Secondary | ICD-10-CM

## 2013-07-22 DIAGNOSIS — Z01812 Encounter for preprocedural laboratory examination: Secondary | ICD-10-CM

## 2013-07-22 DIAGNOSIS — IMO0001 Reserved for inherently not codable concepts without codable children: Secondary | ICD-10-CM

## 2013-07-22 LAB — POCT PREGNANCY, URINE: Preg Test, Ur: NEGATIVE

## 2013-07-22 MED ORDER — MEDROXYPROGESTERONE ACETATE 150 MG/ML IM SUSP: 150.0000 mg | INTRAMUSCULAR | Status: AC

## 2013-07-22 MED ORDER — MEDROXYPROGESTERONE ACETATE 104 MG/0.65ML ~~LOC~~ SUSP
104.0000 mg | Freq: Once | SUBCUTANEOUS | Status: AC
  Administered 2013-07-22: 104 mg via SUBCUTANEOUS

## 2013-07-22 NOTE — Progress Notes (Signed)
Pt reports thick white discharge for the past 3 weeks, denies odor.

## 2013-07-22 NOTE — Addendum Note (Signed)
Addended by: Faythe CasaBELLAMY, Isham Smitherman M on: 07/22/2013 11:40 AM   Modules accepted: Orders

## 2013-07-22 NOTE — Patient Instructions (Signed)
Bacterial Vaginosis Bacterial vaginosis is a vaginal infection that occurs when the normal balance of bacteria in the vagina is disrupted. It results from an overgrowth of certain bacteria. This is the most common vaginal infection in women of childbearing age. Treatment is important to prevent complications, especially in pregnant women, as it can cause a premature delivery. CAUSES  Bacterial vaginosis is caused by an increase in harmful bacteria that are normally present in smaller amounts in the vagina. Several different kinds of bacteria can cause bacterial vaginosis. However, the reason that the condition develops is not fully understood. RISK FACTORS Certain activities or behaviors can put you at an increased risk of developing bacterial vaginosis, including:  Having a new sex partner or multiple sex partners.  Douching.  Using an intrauterine device (IUD) for contraception. Women do not get bacterial vaginosis from toilet seats, bedding, swimming pools, or contact with objects around them. SIGNS AND SYMPTOMS  Some women with bacterial vaginosis have no signs or symptoms. Common symptoms include:  Grey vaginal discharge.  A fishlike odor with discharge, especially after sexual intercourse.  Itching or burning of the vagina and vulva.  Burning or pain with urination. DIAGNOSIS  Your health care provider will take a medical history and examine the vagina for signs of bacterial vaginosis. A sample of vaginal fluid may be taken. Your health care provider will look at this sample under a microscope to check for bacteria and abnormal cells. A vaginal pH test may also be done.  TREATMENT  Bacterial vaginosis may be treated with antibiotic medicines. These may be given in the form of a pill or a vaginal cream. A second round of antibiotics may be prescribed if the condition comes back after treatment.  HOME CARE INSTRUCTIONS   Only take over-the-counter or prescription medicines as  directed by your health care provider.  If antibiotic medicine was prescribed, take it as directed. Make sure you finish it even if you start to feel better.  Do not have sex until treatment is completed.  Tell all sexual partners that you have a vaginal infection. They should see their health care provider and be treated if they have problems, such as a mild rash or itching.  Practice safe sex by using condoms and only having one sex partner. SEEK MEDICAL CARE IF:   Your symptoms are not improving after 3 days of treatment.  You have increased discharge or pain.  You have a fever. MAKE SURE YOU:   Understand these instructions.  Will watch your condition.  Will get help right away if you are not doing well or get worse. FOR MORE INFORMATION  Centers for Disease Control and Prevention, Division of STD Prevention: SolutionApps.co.za American Sexual Health Association (ASHA): www.ashastd.org  Document Released: 05/05/2005 Document Revised: 02/23/2013 Document Reviewed: 12/15/2012 [facility] Patient Information 2014 [place], [place]. Medroxyprogesterone injection [Contraceptive] What is this medicine? MEDROXYPROGESTERONE (me DROX ee proe JES te rone) contraceptive injections prevent pregnancy. They provide effective birth control for 3 months. Depo-subQ Provera 104 is also used for treating pain related to endometriosis. This medicine may be used for other purposes; ask your health care provider or pharmacist if you have questions. COMMON BRAND NAME(S): Depo-Provera, Depo-subQ Provera 104 What should I tell my health care provider before I take this medicine? They need to know if you have any of these conditions: -frequently drink alcohol -asthma -blood vessel disease or a history of a blood clot in the lungs or legs -bone disease such as osteoporosis -breast  cancer -diabetes -eating disorder (anorexia nervosa or bulimia) -high blood pressure -HIV infection or AIDS -kidney  disease -liver disease -mental depression -migraine -seizures (convulsions) -stroke -tobacco smoker -vaginal bleeding -an unusual or allergic reaction to medroxyprogesterone, other hormones, medicines, foods, dyes, or preservatives -pregnant or trying to get pregnant -breast-feeding How should I use this medicine? Depo-Provera Contraceptive injection is given into a muscle. Depo-subQ Provera 104 injection is given under the skin. These injections are given by a health care professional. You must not be pregnant before getting an injection. The injection is usually given during the first 5 days after the start of a menstrual period or 6 weeks after delivery of a baby. Talk to your pediatrician regarding the use of this medicine in children. Special care may be needed. These injections have been used in female children who have started having menstrual periods. Overdosage: If you think you have taken too much of this medicine contact a poison control center or emergency room at once. NOTE: This medicine is only for you. Do not share this medicine with others. What if I miss a dose? Try not to miss a dose. You must get an injection once every 3 months to maintain birth control. If you cannot keep an appointment, call and reschedule it. If you wait longer than 13 weeks between Depo-Provera contraceptive injections or longer than 14 weeks between Depo-subQ Provera 104 injections, you could get pregnant. Use another method for birth control if you miss your appointment. You may also need a pregnancy test before receiving another injection. What may interact with this medicine? Do not take this medicine with any of the following medications: -bosentan This medicine may also interact with the following medications: -aminoglutethimide -antibiotics or medicines for infections, especially rifampin, rifabutin, rifapentine, and griseofulvin -aprepitant -barbiturate medicines such as phenobarbital or  primidone -bexarotene -carbamazepine -medicines for seizures like ethotoin, felbamate, oxcarbazepine, phenytoin, topiramate -modafinil -St. John's wort This list may not describe all possible interactions. Give your health care provider a list of all the medicines, herbs, non-prescription drugs, or dietary supplements you use. Also tell them if you smoke, drink alcohol, or use illegal drugs. Some items may interact with your medicine. What should I watch for while using this medicine? This drug does not protect you against HIV infection (AIDS) or other sexually transmitted diseases. Use of this product may cause you to lose calcium from your bones. Loss of calcium may cause weak bones (osteoporosis). Only use this product for more than 2 years if other forms of birth control are not right for you. The longer you use this product for birth control the more likely you will be at risk for weak bones. Ask your health care professional how you can keep strong bones. You may have a change in bleeding pattern or irregular periods. Many females stop having periods while taking this drug. If you have received your injections on time, your chance of being pregnant is very low. If you think you may be pregnant, see your health care professional as soon as possible. Tell your health care professional if you want to get pregnant within the next year. The effect of this medicine may last a long time after you get your last injection. What side effects may I notice from receiving this medicine? Side effects that you should report to your doctor or health care professional as soon as possible: -allergic reactions like skin rash, itching or hives, swelling of the face, lips, or tongue -breast tenderness or discharge -  breathing problems -changes in vision -depression -feeling faint or lightheaded, falls -fever -pain in the abdomen, chest, groin, or leg -problems with balance, talking, walking -unusually weak  or tired -yellowing of the eyes or skin Side effects that usually do not require medical attention (report to your doctor or health care professional if they continue or are bothersome): -acne -fluid retention and swelling -headache -irregular periods, spotting, or absent periods -temporary pain, itching, or skin reaction at site where injected -weight gain This list may not describe all possible side effects. Call your doctor for medical advice about side effects. You may report side effects to FDA at 1-800-FDA-1088. Where should I keep my medicine? This does not apply. The injection will be given to you by a health care professional. NOTE: This sheet is a summary. It may not cover all possible information. If you have questions about this medicine, talk to your doctor, pharmacist, or health care provider.  2014, Elsevier/Gold Standard. (2008-05-26 18:37:56)

## 2013-07-22 NOTE — Progress Notes (Signed)
Patient ID: Alicia [name], female   DOB: 10/26/1975, 38 y.o.   MRN: [id number]  History:  Ms. Alicia [name] is a 38 y.o. [id number] who presents to clinic today for vaginal discharge and birth control initiation. The patient states that she was on Depo Provera recently, last dose was 01/2013. She denies vaginal bleeding since last Depo injection. She states last intercourse within the last 2 weeks with condoms. She also complains of discharge today. Describes this as thick, white discharge without odor. She denies itching or irritation. She last had BV ~ 1 year ago while pregnant and was given Metrogel which she felt worked well.   The following portions of the patient's history were reviewed and updated as appropriate: allergies, current medications, past family history, past medical history, past social history, past surgical history and problem list.  Review of Systems:  Pertinent items are noted in HPI.  Objective:  Physical Exam BP 124/93  Pulse 71  Temp(Src) 97.5 F (36.4 C)  Ht 5\' 3"  (1.6 m)  Wt 161 lb 3.2 oz (73.12 kg)  BMI 28.56 kg/m2  Breastfeeding? Yes GENERAL: Well-developed, well-nourished female in no acute distress.  HEENT: Normocephalic, atraumatic.  LUNGS: Normal rate. Clear to auscultation bilaterally.  HEART: Regular rate and rhythm with no adventitious sounds.  ABDOMEN: Soft, nontender, nondistended. No organomegaly. Normal bowel sounds appreciated in all quadrants.  PELVIC: Normal external female genitalia. Vagina is pink and rugated.  Normal discharge. Normal cervix contour. Wet prep obtained. Uterus is normal in size. No adnexal mass or tenderness.  EXTREMITIES: No cyanosis, clubbing, or edema  Labs and Imaging Wet prep obtained  Assessment & Plan:  Assessment: Vaginal discharge Counseling for initiation of Depo Provera  Plans: Wet prep obtained. Patient will be contacted with abnormal results Depo Provera restarted today [facility]  [facility] for next injection or PRN  Alicia StarrJulie N Ethier, PA-C 07/22/2013 11:05 AM

## 2013-07-23 LAB — WET PREP, GENITAL
TRICH WET PREP: NONE SEEN
WBC WET PREP: NONE SEEN
Yeast Wet Prep HPF POC: NONE SEEN

## 2013-07-24 ENCOUNTER — Telehealth: Payer: Self-pay | Admitting: Medical

## 2013-07-24 DIAGNOSIS — B9689 Other specified bacterial agents as the cause of diseases classified elsewhere: Secondary | ICD-10-CM

## 2013-07-24 DIAGNOSIS — N760 Acute vaginitis: Principal | ICD-10-CM

## 2013-07-24 LAB — GC/CHLAMYDIA PROBE AMP
CT PROBE, AMP APTIMA: NEGATIVE
GC Probe RNA: NEGATIVE

## 2013-07-24 MED ORDER — METRONIDAZOLE 0.75 % VA GEL: 1.0000 | Freq: Two times a day (BID) | VAGINAL | Status: DC

## 2013-07-24 NOTE — Telephone Encounter (Signed)
Called patient and informed her of BV dx. Rx sent to patient's pharmacy. Pharmacy verified with patient.   Freddi StarrJulie N Ethier, PA-C 07/24/2013 1:12 PM

## 2013-10-14 ENCOUNTER — Ambulatory Visit (INDEPENDENT_AMBULATORY_CARE_PROVIDER_SITE_OTHER): Payer: Medicaid Other | Admitting: *Deleted

## 2013-10-14 VITALS — BP 129/90 | HR 75 | Temp 98.1°F | Wt 157.8 lb

## 2013-10-14 DIAGNOSIS — IMO0001 Reserved for inherently not codable concepts without codable children: Secondary | ICD-10-CM

## 2013-10-14 DIAGNOSIS — Z3049 Encounter for surveillance of other contraceptives: Secondary | ICD-10-CM

## 2013-10-14 MED ORDER — MEDROXYPROGESTERONE ACETATE 104 MG/0.65ML ~~LOC~~ SUSP
104.0000 mg | Freq: Once | SUBCUTANEOUS | Status: AC
  Administered 2013-10-14: 104 mg via SUBCUTANEOUS

## 2013-12-02 IMAGING — US US OB NUCHAL TRANSLUCENCY 1ST GEST
1 series · 13 of 28 positions shown · non-contrast
Comparison: none

[Series 1: us ob nuchal translucency 1st gest · 0.18mm/px · 13 of 32 slices shown]
[im 2/32]
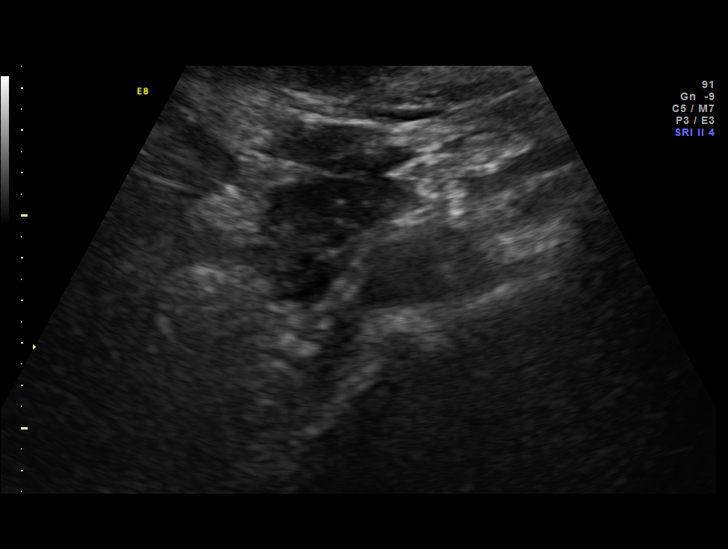
[im 4/32]
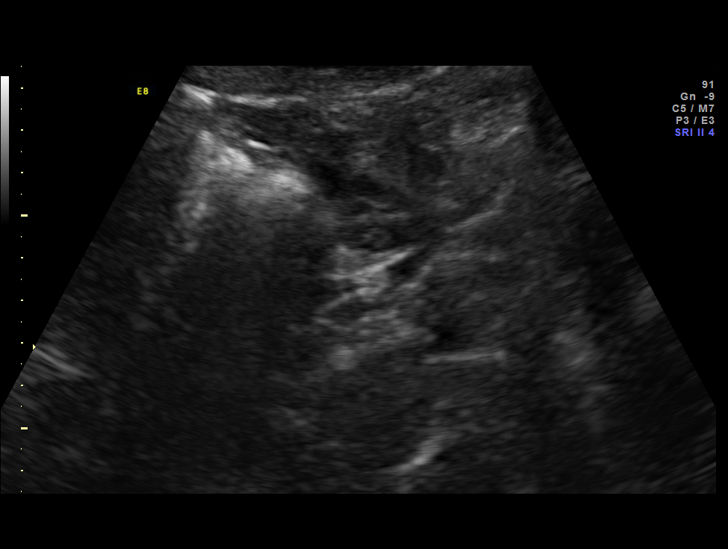
[im 6/32]
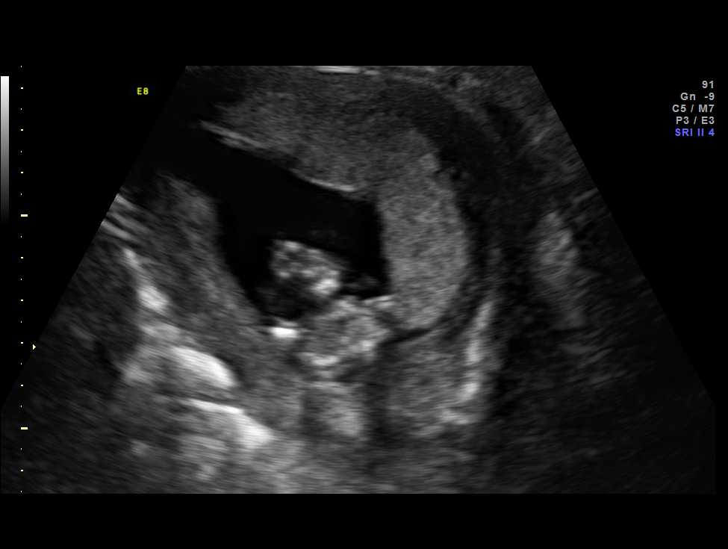
[im 9/32]
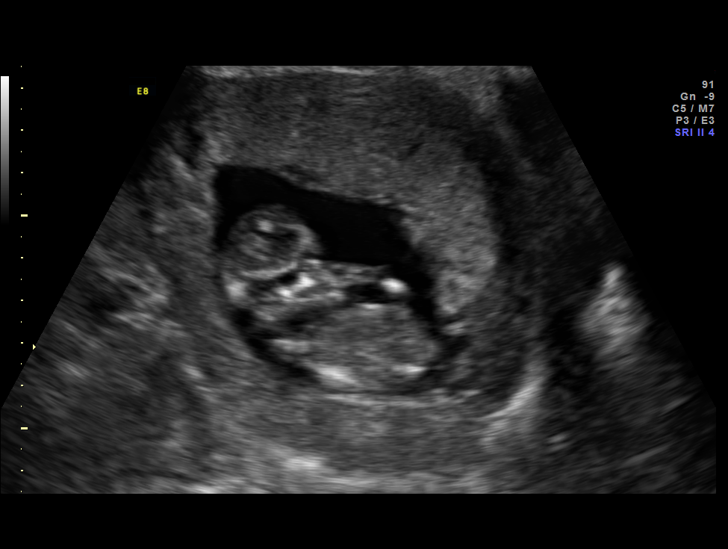
[im 11/32]
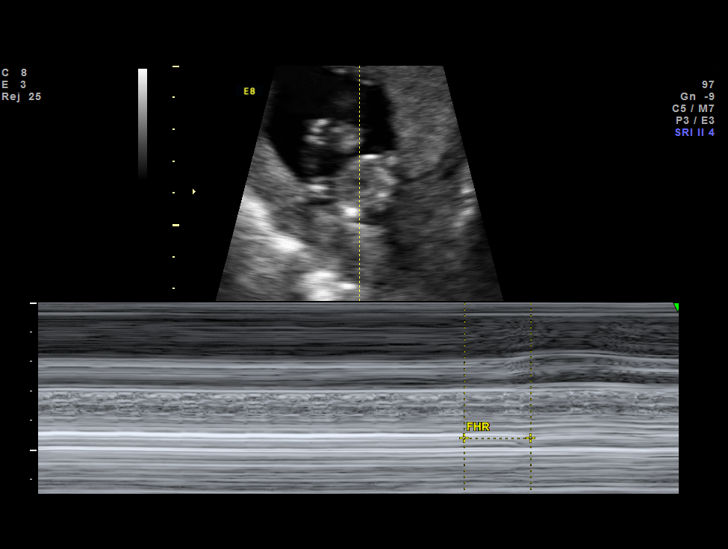
[im 13/32]
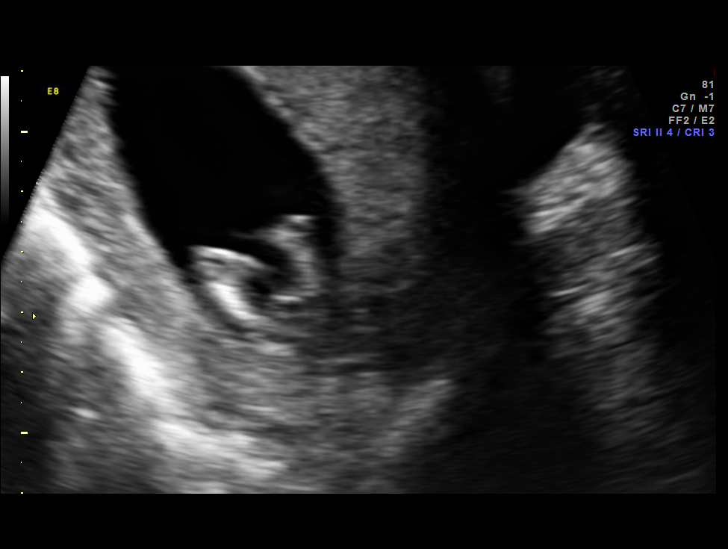
[im 17/32]
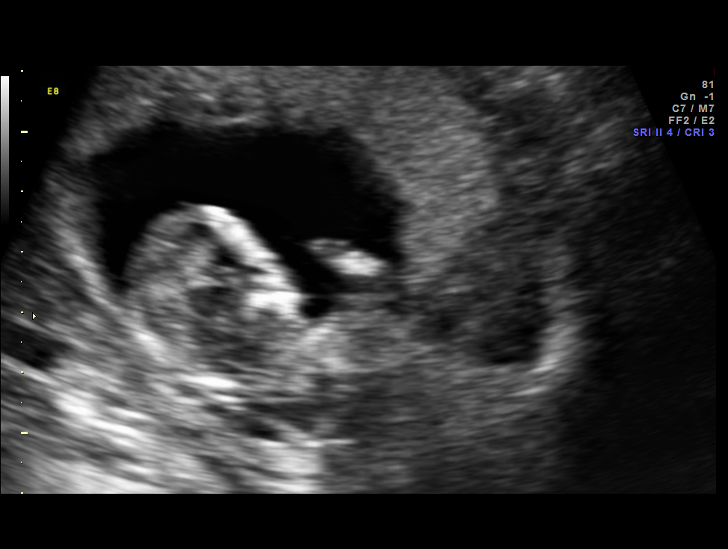
[im 19/32]
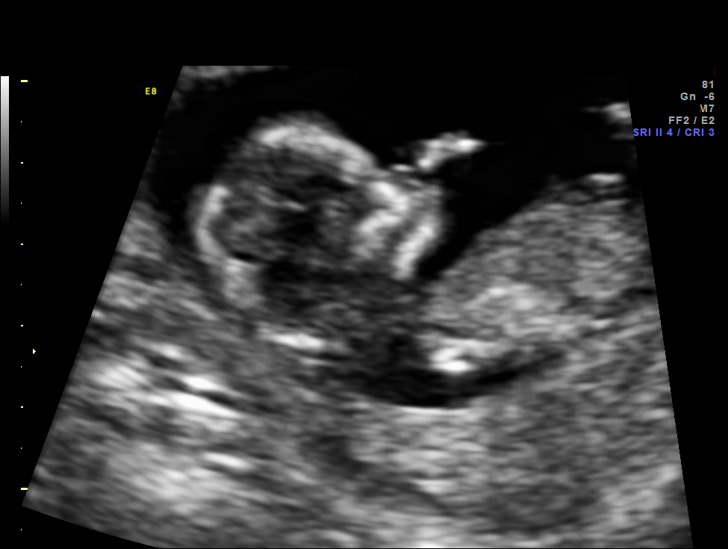
[im 21/32]
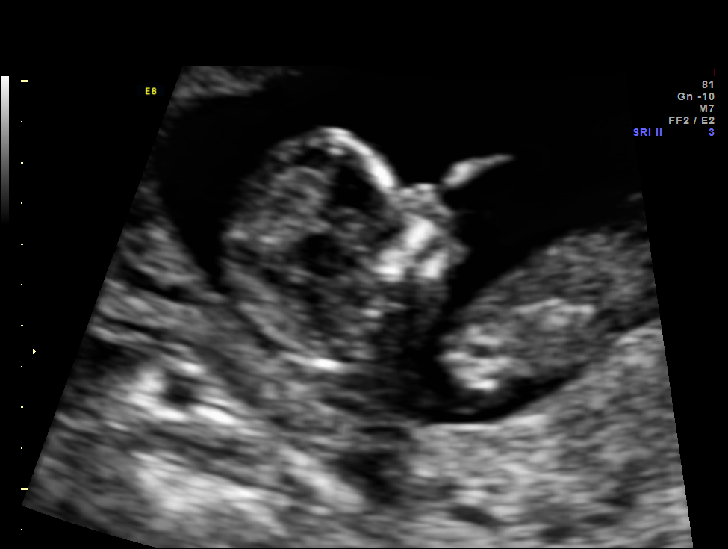
[im 23/32]
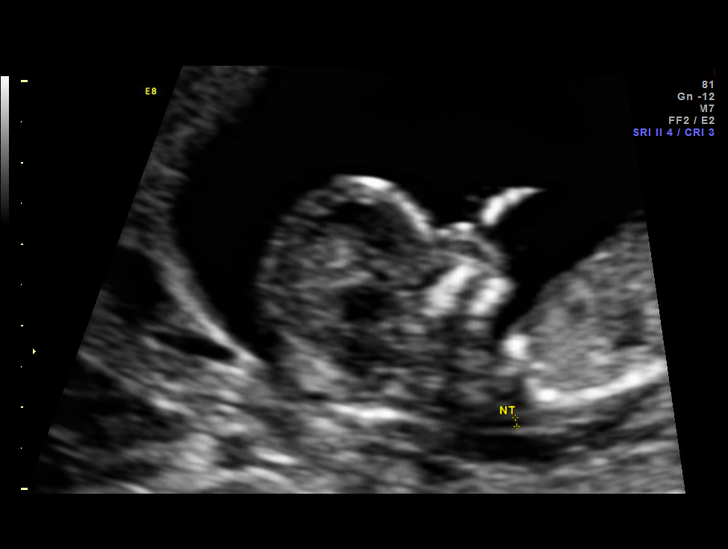
[im 26/32]
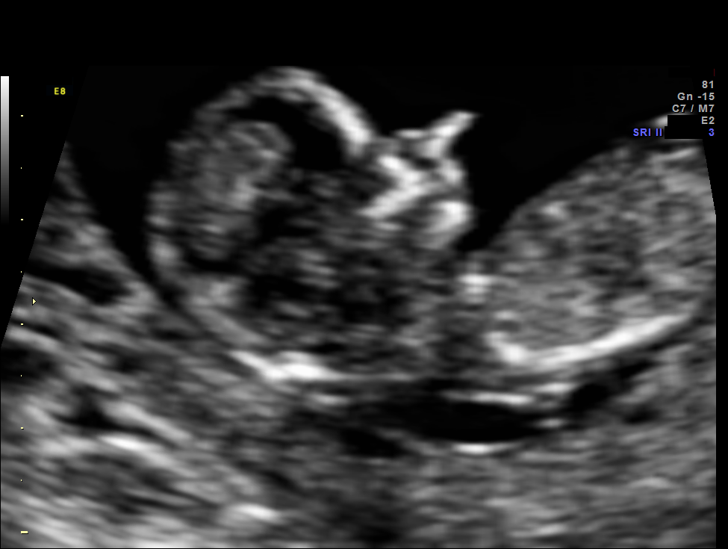
[im 28/32]
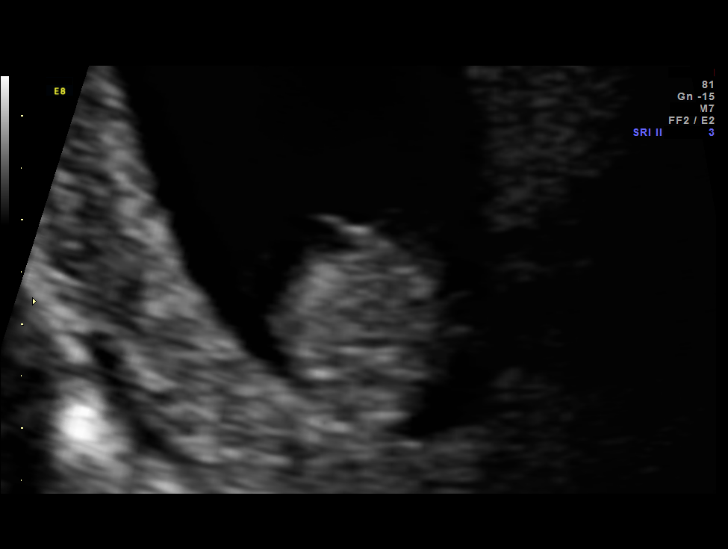
[im 30/32]
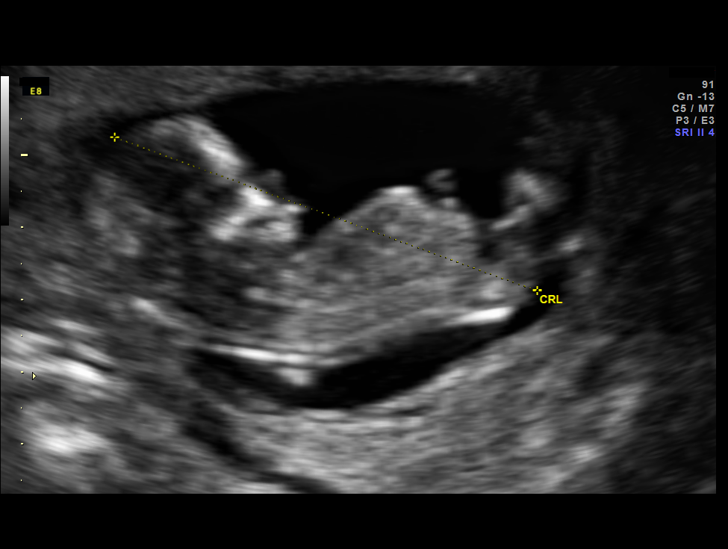

[13 of 28 positions shown; findings below may reference images not displayed]

OBSTETRICS REPORT
                      (Signed Final 06/11/2012 [DATE])

Service(s) Provided

 US FETAL NUCHAL TRANSLUCENCY                          76813.0
 MEASUREMENT
Indications

 First trimester aneuploidy screen (NT)
 Advanced maternal age (AMA), Multigravida (36)
 Cigarette smoker
 Previous cesarean section
 Poor obstetric history: Previous fetal growth
 restriction (FGR)
 Poor obstetric history: Previous preterm delivery
Fetal Evaluation

 Num Of Fetuses:    1
 Cardiac Activity:  Observed
 Presentation:      Variable
 Placenta:          Anterior, above cervical os
 P. Cord            Visualized
 Insertion:

 Amniotic Fluid
 AFI FV:      Subjectively within normal limits
Gestational Age

 Best:          12w 5d     Det. By:  U/S C R L (04/30/12)     EDD:   12/19/12
1st Trimester Genetic Sonogram Screening

 CRL:            62.2  mm    G. Age:   12w 3d                 EDD:   12/21/12
 Nuc Trans:       1.3  mm

 Nasal Bone:                 Absent
Cervix Uterus Adnexa

 Cervix:       Normal appearance by transabdominal scan. Appears
               closed, without funnelling.

 Left Ovary:    Within normal limits.
 Right Ovary:   Within normal limits.
Impression

 IUP at 12+5 weeks
 No gross abnormalities identified
 NT measurement was within normal limits for this GA; NB
 absent
 Normal amniotic fluid volume
 Measurements consistent with prior US

 After genetic counseling, Ms. Minaya opted for cffDNA
 ([facility]) screening instead of first trimester screening. NT
 measurement was normal.
Recommendations

 Please see genetic counseling note
 Follow-up ultrasound in 5-6 weeks for detailed anatomic
 survey

## 2014-01-06 ENCOUNTER — Ambulatory Visit: Payer: Medicaid Other

## 2014-01-25 ENCOUNTER — Ambulatory Visit (INDEPENDENT_AMBULATORY_CARE_PROVIDER_SITE_OTHER): Payer: Medicaid Other | Admitting: General Practice

## 2014-01-25 VITALS — BP 121/80 | HR 62 | Temp 98.0°F | Ht 63.0 in | Wt 160.7 lb

## 2014-01-25 DIAGNOSIS — Z3049 Encounter for surveillance of other contraceptives: Secondary | ICD-10-CM

## 2014-01-25 DIAGNOSIS — Z3042 Encounter for surveillance of injectable contraceptive: Secondary | ICD-10-CM

## 2014-01-25 LAB — POCT PREGNANCY, URINE: Preg Test, Ur: NEGATIVE

## 2014-01-25 MED ORDER — MEDROXYPROGESTERONE ACETATE 104 MG/0.65ML ~~LOC~~ SUSP
104.0000 mg | Freq: Once | SUBCUTANEOUS | Status: AC
  Administered 2014-01-25: 104 mg via SUBCUTANEOUS

## 2014-01-25 NOTE — Progress Notes (Signed)
Patient here for depo today, 5 days late for injection. Obtained upt. Upt negative, will give dose.

## 2014-02-18 ENCOUNTER — Emergency Department (HOSPITAL_COMMUNITY)
Admission: EM | Admit: 2014-02-18 | Discharge: 2014-02-18 | Disposition: A | Discharge status: Home / Self Care | Payer: Medicaid Other | Attending: Emergency Medicine | Admitting: Emergency Medicine

## 2014-02-18 ENCOUNTER — Encounter (HOSPITAL_COMMUNITY): Payer: Self-pay | Admitting: Emergency Medicine

## 2014-02-18 DIAGNOSIS — Z87440 Personal history of urinary (tract) infections: Secondary | ICD-10-CM | POA: Insufficient documentation

## 2014-02-18 DIAGNOSIS — Z87891 Personal history of nicotine dependence: Secondary | ICD-10-CM | POA: Insufficient documentation

## 2014-02-18 DIAGNOSIS — N898 Other specified noninflammatory disorders of vagina: Secondary | ICD-10-CM | POA: Insufficient documentation

## 2014-02-18 DIAGNOSIS — Z79899 Other long term (current) drug therapy: Secondary | ICD-10-CM | POA: Insufficient documentation

## 2014-02-18 DIAGNOSIS — Z3202 Encounter for pregnancy test, result negative: Secondary | ICD-10-CM | POA: Insufficient documentation

## 2014-02-18 DIAGNOSIS — R300 Dysuria: Secondary | ICD-10-CM

## 2014-02-18 DIAGNOSIS — D649 Anemia, unspecified: Secondary | ICD-10-CM | POA: Insufficient documentation

## 2014-02-18 LAB — URINALYSIS, ROUTINE W REFLEX MICROSCOPIC
BILIRUBIN URINE: NEGATIVE
GLUCOSE, UA: NEGATIVE mg/dL
Ketones, ur: NEGATIVE mg/dL
Leukocytes, UA: NEGATIVE
Nitrite: NEGATIVE
PROTEIN: NEGATIVE mg/dL
Specific Gravity, Urine: 1.026 (ref 1.005–1.030)
Urobilinogen, UA: 0.2 mg/dL (ref 0.0–1.0)
pH: 6 (ref 5.0–8.0)

## 2014-02-18 LAB — URINE MICROSCOPIC-ADD ON

## 2014-02-18 LAB — WET PREP, GENITAL
Clue Cells Wet Prep HPF POC: NONE SEEN
Trich, Wet Prep: NONE SEEN
Yeast Wet Prep HPF POC: NONE SEEN

## 2014-02-18 LAB — POC URINE PREG, ED: PREG TEST UR: NEGATIVE

## 2014-02-18 MED ORDER — SULFAMETHOXAZOLE-TRIMETHOPRIM 800-160 MG PO TABS: 1.0000 | ORAL_TABLET | Freq: Two times a day (BID) | ORAL | Status: AC

## 2014-02-18 MED ORDER — PHENAZOPYRIDINE HCL 200 MG PO TABS: 200.0000 mg | ORAL_TABLET | Freq: Three times a day (TID) | ORAL | Status: DC | PRN

## 2014-02-18 NOTE — ED Provider Notes (Signed)
CSN: [id number]     Arrival date & time 02/18/14  1801 History   First MD Initiated Contact with Patient 02/18/14 1919     Chief Complaint  Patient presents with  . Dysuria  . Vaginal Discharge  . Insect Bite     (Consider location/radiation/quality/duration/timing/severity/associated sxs/prior Treatment) Patient is a 38 y.o. female presenting with dysuria.  Dysuria Pain quality:  Burning Pain severity:  Moderate Onset quality:  Gradual Duration:  4 days Timing:  Constant Progression:  Unchanged Chronicity:  Recurrent Recent urinary tract infections: yes   Relieved by:  None tried Worsened by:  Nothing tried Ineffective treatments:  None tried Urinary symptoms: no discolored urine, no foul-smelling urine and no hematuria   Associated symptoms: vaginal discharge   Associated symptoms: no abdominal pain, no fever, no nausea and no vomiting   Risk factors: sexually active and sexually transmitted infections     Past Medical History  Diagnosis Date  . No pertinent past medical history   . Anemia    Past Surgical History  Procedure Laterality Date  . Foot surgery    . Cesarean section    . Incision and drainage intra oral abscess Right 07/06/2012   Family History  Problem Relation Age of Onset  . Other Neg Hx   . Diabetes Paternal Grandmother   . Congenital heart disease Son   . Hypertension Mother    History  Substance Use Topics  . Smoking status: Former Smoker -- 0.50 packs/day    Types: Cigarettes    Quit date: 06/07/2012  . Smokeless tobacco: Never Used     Comment: Patient stopped smoking when she found out she was pregnant  . Alcohol Use: No   OB History   Grav Para Term Preterm Abortions TAB SAB Ect Mult Living   7 6 5 1 1  1   6      Review of Systems  Constitutional: Negative for fever and chills.  HENT: Negative for congestion and sore throat.   Eyes: Negative for visual disturbance.  Respiratory: Negative for cough, shortness of breath and  wheezing.   Cardiovascular: Negative for chest pain.  Gastrointestinal: Negative for nausea, vomiting, abdominal pain, diarrhea and constipation.  Genitourinary: Positive for dysuria and vaginal discharge. Negative for difficulty urinating and vaginal pain.  Musculoskeletal: Negative for arthralgias and myalgias.  Skin: Negative for rash.  Neurological: Negative for syncope and headaches.  Psychiatric/Behavioral: Negative for behavioral problems.  All other systems reviewed and are negative.     Allergies  Review of patient's allergies indicates no known allergies.  Home Medications   Prior to Admission medications   Medication Sig Start Date End Date Taking? Authorizing Provider  medroxyPROGESTERone (DEPO-PROVERA) 150 MG/ML injection Inject 150 mg into the muscle every 3 (three) months.   Yes Historical Provider, MD  Multiple Vitamin (MULTIVITAMIN WITH MINERALS) TABS tablet Take 1 tablet by mouth daily.   Yes Historical Provider, MD  phenazopyridine (PYRIDIUM) 200 MG tablet Take 1 tablet (200 mg total) by mouth 3 (three) times daily as needed for pain. 02/18/14   Beverely Risenennis Chaelyn Bunyan, MD  sulfamethoxazole-trimethoprim (SEPTRA DS) 800-160 MG per tablet Take 1 tablet by mouth 2 (two) times daily. 02/18/14 02/25/14  Beverely Risenennis Johanny Segers, MD   BP 119/86  Pulse 67  Temp(Src) 98.4 F (36.9 C) (Oral)  Resp 20  SpO2 100% Physical Exam  Vitals reviewed. Constitutional: She is oriented to person, place, and time. She appears well-developed and well-nourished. No distress.  HENT:  Head: Normocephalic and  atraumatic.  Eyes: EOM are normal.  Neck: Normal range of motion.  Cardiovascular: Normal rate, regular rhythm and normal heart sounds.   No murmur heard. Pulmonary/Chest: Effort normal and breath sounds normal. No respiratory distress. She has no wheezes.  Abdominal: Soft. There is no tenderness.  Genitourinary: There is no lesion on the right labia. There is no lesion on the left labia. Cervix exhibits  no motion tenderness, no discharge and no friability. No erythema around the vagina. Vaginal discharge found.  Musculoskeletal: She exhibits no edema.  Neurological: She is alert and oriented to person, place, and time.  Skin: She is not diaphoretic.  Psychiatric: She has a normal mood and affect. Her behavior is normal.    ED Course  Procedures (including critical care time) Labs Review Labs Reviewed  WET PREP, GENITAL - Abnormal; Notable for the following:    WBC, Wet Prep HPF POC MODERATE (*)    All other components within normal limits  URINALYSIS, ROUTINE W REFLEX MICROSCOPIC - Abnormal; Notable for the following:    APPearance HAZY (*)    Hgb urine dipstick TRACE (*)    All other components within normal limits  URINE MICROSCOPIC-ADD ON - Abnormal; Notable for the following:    Squamous Epithelial / LPF MANY (*)    Bacteria, UA FEW (*)    All other components within normal limits  GC/CHLAMYDIA PROBE AMP  URINE CULTURE  POC URINE PREG, ED    Imaging Review No results found.   EKG Interpretation None      MDM   Final diagnoses:  Dysuria    Patient is a 62 stroke female with past history significant for recurrent UTIs. Patient had 4 days of dysuria and vaginal discharge. Patient denies fever, nausea vomiting. On exam patient has white discharge, no adnexal or cervical motion tenderness Will get a wet prep GC chlamydia. Patient's pregnancy is negative. Will get a UA.  Patient's workup unremarkable however given symptoms we will treat with antibiotics. Patient given return precautions.      Beverely Risen, MD 02/18/14 2033

## 2014-02-18 NOTE — ED Notes (Signed)
Pt presents to department for evaluation of dysuria, vaginal discharge and possible insect bite to R shoulder. Symptoms ongoing for several days. No signs of distress noted.

## 2014-02-18 NOTE — ED Provider Notes (Signed)
I saw and evaluated the patient, reviewed the resident's note and I agree with the findings and plan.   EKG Interpretation None      Alicia [name] is a 38 y.o. female hx of UTIs here with dysuria and vag discharge for the last 4 days. Well appearing on exam. Abdomen nontender. Resident performed pelvic exam. Wet prep + WBC. UA showed few bacteria. Given that she had symptoms, will d/c on bactrim empirically. Will send urine culture.    Richardean Canalavid H Reynol Arnone, MD 02/18/14 2325

## 2014-02-18 NOTE — ED Notes (Signed)
Pt reports painful urination and vaginal discharge after having sex 4 days ago. Pt also reports insect bite to rt shoulder, and elbow. Pt alert and oriented x 4, no distress noted. Pt states discharge does not have foul odor but does itch.

## 2014-02-18 NOTE — ED Notes (Signed)
Discharge instructions reviewed with pt. Pt verbalized understanding.   

## 2014-02-18 NOTE — Discharge Instructions (Signed)

## 2014-02-20 LAB — URINE CULTURE

## 2014-02-20 LAB — GC/CHLAMYDIA PROBE AMP
CT Probe RNA: NEGATIVE
GC Probe RNA: NEGATIVE

## 2014-03-20 ENCOUNTER — Encounter (HOSPITAL_COMMUNITY): Payer: Self-pay | Admitting: Emergency Medicine

## 2014-04-19 ENCOUNTER — Ambulatory Visit: Payer: Medicaid Other

## 2014-06-07 ENCOUNTER — Ambulatory Visit: Payer: Self-pay

## 2014-12-18 ENCOUNTER — Encounter (HOSPITAL_COMMUNITY): Payer: Self-pay | Admitting: Emergency Medicine

## 2014-12-18 ENCOUNTER — Emergency Department (HOSPITAL_COMMUNITY)
Admission: EM | Admit: 2014-12-18 | Discharge: 2014-12-19 | Disposition: A | Discharge status: Home / Self Care | Payer: Medicaid Other | Attending: Emergency Medicine | Admitting: Emergency Medicine

## 2014-12-18 ENCOUNTER — Emergency Department (HOSPITAL_COMMUNITY): Payer: Medicaid Other

## 2014-12-18 DIAGNOSIS — Z87891 Personal history of nicotine dependence: Secondary | ICD-10-CM | POA: Diagnosis not present

## 2014-12-18 DIAGNOSIS — R079 Chest pain, unspecified: Secondary | ICD-10-CM

## 2014-12-18 DIAGNOSIS — Z862 Personal history of diseases of the blood and blood-forming organs and certain disorders involving the immune mechanism: Secondary | ICD-10-CM | POA: Insufficient documentation

## 2014-12-18 LAB — COMPREHENSIVE METABOLIC PANEL
ALT: 13 U/L — ABNORMAL LOW (ref 14–54)
AST: 23 U/L (ref 15–41)
Albumin: 3.5 g/dL (ref 3.5–5.0)
Alkaline Phosphatase: 69 U/L (ref 38–126)
Anion gap: 11 (ref 5–15)
BILIRUBIN TOTAL: 0.3 mg/dL (ref 0.3–1.2)
BUN: 11 mg/dL (ref 6–20)
CALCIUM: 9 mg/dL (ref 8.9–10.3)
CO2: 23 mmol/L (ref 22–32)
Chloride: 107 mmol/L (ref 101–111)
Creatinine, Ser: 0.75 mg/dL (ref 0.44–1.00)
GFR calc Af Amer: >60 mL/min (ref >60)
GFR calc non Af Amer: >60 mL/min (ref >60)
Glucose, Bld: 102 mg/dL — ABNORMAL HIGH (ref 65–99)
Potassium: 3.4 mmol/L — ABNORMAL LOW (ref 3.5–5.1)
SODIUM: 141 mmol/L (ref 135–145)
Total Protein: 7.2 g/dL (ref 6.5–8.1)

## 2014-12-18 LAB — CBC
HCT: 37.3 % (ref 36.0–46.0)
Hemoglobin: 12.4 g/dL (ref 12.0–15.0)
MCH: 31.3 pg (ref 26.0–34.0)
MCHC: 33.2 g/dL (ref 30.0–36.0)
MCV: 94.2 fL (ref 78.0–100.0)
Platelets: 289 10*3/uL (ref 150–400)
RBC: 3.96 MIL/uL (ref 3.87–5.11)
RDW: 13.1 % (ref 11.5–15.5)
WBC: 5.2 10*3/uL (ref 4.0–10.5)

## 2014-12-18 LAB — I-STAT TROPONIN, ED: TROPONIN I, POC: 0 ng/mL (ref 0.00–0.08)

## 2014-12-18 LAB — MAGNESIUM: MAGNESIUM: 1.8 mg/dL (ref 1.7–2.4)

## 2014-12-18 LAB — LIPASE, BLOOD: Lipase: 23 U/L (ref 22–51)

## 2014-12-18 MED ORDER — ASPIRIN 81 MG PO CHEW
324.0000 mg | CHEWABLE_TABLET | Freq: Once | ORAL | Status: AC
  Administered 2014-12-18: 324 mg via ORAL
  Filled 2014-12-18: qty 4

## 2014-12-18 NOTE — ED Notes (Signed)
Per pt co recurrent chest pain started today. Pt described it as pressure. Denies nausea nor Hx GERD. Also denies sob or coughing.

## 2014-12-18 NOTE — ED Provider Notes (Signed)
CSN: [id number]     Arrival date & time 12/18/14  1942 History   First MD Initiated Contact with Patient 12/18/14 2013     Chief Complaint  Patient presents with  . Chest Pain     (Consider location/radiation/quality/duration/timing/severity/associated sxs/prior Treatment) Patient is a 39 y.o. female presenting with chest pain.  Chest Pain Pain location:  Substernal area Pain quality: pressure and sharp   Pain quality comment:  "elephant sitting on chest" Pain radiates to:  Does not radiate Pain radiates to the back: no   Pain severity:  No pain Onset quality:  Sudden Duration:  2 hours Timing:  Constant (for 30 minutes was severe, went away, then 15 min later came back until arrival here and resolved) Progression:  Resolved Chronicity:  Recurrent (years ago) Context: eating   Associated symptoms: no abdominal pain, no back pain, no cough, no diaphoresis, no fever, no headache, no nausea, no shortness of breath and not vomiting   Risk factors: smoking   Risk factors: no coronary artery disease, no diabetes mellitus, no high cholesterol, no hypertension and no prior DVT/PE   Risk factors comment:  Mid 60s paternal grandparents   Past Medical History  Diagnosis Date  . No pertinent past medical history   . Anemia    Past Surgical History  Procedure Laterality Date  . Foot surgery    . Cesarean section    . Incision and drainage intra oral abscess Right 07/06/2012   Family History  Problem Relation Age of Onset  . Other Neg Hx   . Diabetes Paternal Grandmother   . Congenital heart disease Son   . Hypertension Mother    History  Substance Use Topics  . Smoking status: Former Smoker -- 0.50 packs/day    Types: Cigarettes    Quit date: 06/07/2012  . Smokeless tobacco: Never Used     Comment: Patient stopped smoking when she found out she was pregnant  . Alcohol Use: No   OB History    Gravida Para Term Preterm AB TAB SAB Ectopic Multiple Living   7 6 5 1 1  1   6      Review of Systems  Constitutional: Negative for fever and diaphoresis.  HENT: Negative for sore throat.   Eyes: Negative for visual disturbance.  Respiratory: Negative for cough and shortness of breath.   Cardiovascular: Positive for chest pain.  Gastrointestinal: Negative for nausea, vomiting and abdominal pain.  Genitourinary: Negative for difficulty urinating.  Musculoskeletal: Negative for back pain and neck pain.  Skin: Negative for rash.  Neurological: Negative for syncope and headaches.      Allergies  Review of patient's allergies indicates no known allergies.  Home Medications   Prior to Admission medications   Medication Sig Start Date End Date Taking? Authorizing Provider  medroxyPROGESTERone (DEPO-PROVERA) 150 MG/ML injection Inject 150 mg into the muscle every 3 (three) months.    Historical Provider, MD  phenazopyridine (PYRIDIUM) 200 MG tablet Take 1 tablet (200 mg total) by mouth 3 (three) times daily as needed for pain. Patient not taking: Reported on 12/18/2014 02/18/14   Beverely Risen, MD   BP 124/74 mmHg  Pulse 84  Temp(Src) 98.7 F (37.1 C)  Resp 18  SpO2 98%  Breastfeeding? Yes Physical Exam  Constitutional: She is oriented to person, place, and time. She appears well-developed and well-nourished. No distress.  HENT:  Head: Normocephalic and atraumatic.  Eyes: Conjunctivae and EOM are normal.  Neck: Normal range of motion.  Cardiovascular: Normal rate, regular rhythm, normal heart sounds and intact distal pulses.  Exam reveals no gallop and no friction rub.   No murmur heard. Pulmonary/Chest: Effort normal and breath sounds normal. No respiratory distress. She has no wheezes. She has no rales.  Abdominal: Soft. She exhibits no distension. There is no tenderness. There is no guarding and negative Murphy's sign.  Musculoskeletal: She exhibits no edema or tenderness.  Neurological: She is alert and oriented to person, place, and time.  Skin: Skin is warm  and dry. No rash noted. She is not diaphoretic. No erythema.  Nursing note and vitals reviewed.   ED Course  Procedures (including critical care time) Labs Review Labs Reviewed - No data to display  Imaging Review No results found.   EKG Interpretation   Date/Time:  Monday December 18 2014 19:55:26 EDT Ventricular Rate:  84 PR Interval:  143 QRS Duration: 92 QT Interval:  371 QTC Calculation: 438 R Axis:   92 Text Interpretation:  Borderline right axis deviation Borderline T wave  abnormalities Baseline wander in lead(s) I aVL Confirmed by [facility] MD,  Brekyn Huntoon ([id number]) on 12/18/2014 9:00:39 PM      MDM   Final diagnoses:  None   39 year old female with history of smoking presents with concern of chest pain. Differential diagnosis for chest pain includes pulmonary embolus, dissection, pneumothorax, pneumonia, ACS, myocarditis, pericarditis.  EKG was done and evaluate by me and showed no acute ST changes and no signs of pericarditis. Chest x-ray was done and evaluated by me and radiology and showed no sign of pneumonia or pneumothorax. (Question of nipple shadow vs other discussed with pt and recommend outpt XR at one time.)  Patient is PERC negative and low risk Wells and have low suspicion for PE.  Patient is low risk HEART score and had delta troponins which were both negative. Given this evaluation, history and physical have low suspicion for pulmonary embolus, pneumonia, ACS, myocarditis, pericarditis, dissection.   Given pain worse with eating, other possible etiologies include GERD and pt was recommended protonix. Patient discharged in stable condition with understanding of reasons to return and recommend PCP follow up.   Alvira Monday, MD 12/19/14 0140

## 2014-12-19 LAB — I-STAT TROPONIN, ED: Troponin i, poc: 0 ng/mL (ref 0.00–0.08)

## 2014-12-19 MED ORDER — PANTOPRAZOLE SODIUM 20 MG PO TBEC: 40.0000 mg | DELAYED_RELEASE_TABLET | Freq: Every day | ORAL | Status: DC

## 2014-12-19 NOTE — Discharge Instructions (Signed)
Chest Pain (Nonspecific) °It is often hard to give a diagnosis for the cause of chest pain. There is always a chance that your pain could be related to something serious, such as a heart attack or a blood clot in the lungs. You need to follow up with your doctor. °HOME CARE °· If antibiotic medicine was given, take it as directed by your doctor. Finish the medicine even if you start to feel better. °· For the next few days, avoid activities that bring on chest pain. Continue physical activities as told by your doctor. °· Do not use any tobacco products. This includes cigarettes, chewing tobacco, and e-cigarettes. °· Avoid drinking alcohol. °· Only take medicine as told by your doctor. °· Follow your doctor's suggestions for more testing if your chest pain does not go away. °· Keep all doctor visits you made. °GET HELP IF: °· Your chest pain does not go away, even after treatment. °· You have a rash with blisters on your chest. °· You have a fever. °GET HELP RIGHT AWAY IF:  °· You have more pain or pain that spreads to your arm, neck, jaw, back, or belly (abdomen). °· You have shortness of breath. °· You cough more than usual or cough up blood. °· You have very bad back or belly pain. °· You feel sick to your stomach (nauseous) or throw up (vomit). °· You have very bad weakness. °· You pass out (faint). °· You have chills. °This is an emergency. Do not wait to see if the problems will go away. Call your local emergency services (911 in [place] Do not drive yourself to the hospital. °MAKE SURE YOU:  °· Understand these instructions. °· Will watch your condition. °· Will get help right away if you are not doing well or get worse. °Document Released: 10/22/2007 Document Revised: 05/10/2013 Document Reviewed: 10/22/2007 °ExitCare® Patient Information ©2015 [employer]. This information is not intended to replace advice given to you by your health care provider. Make sure you discuss any questions you have with your  health care provider. ° ° °Emergency Department Resource Guide °1) Find a Doctor and Pay Out of Pocket °Although you won't have to find out who is covered by your insurance plan, it is a good idea to ask around and get recommendations. You will then need to call the office and see if the doctor you have chosen will accept you as a new patient and what types of options they offer for patients who are self-pay. Some doctors offer discounts or will set up payment plans for their patients who do not have insurance, but you will need to ask so you aren't surprised when you get to your appointment. ° °2) Contact Your Local Health Department °Not all health departments have doctors that can see patients for sick visits, but many do, so it is worth a call to see if yours does. If you don't know where your local health department is, you can check in your phone book. The CDC also has a tool to help you locate your state's health department, and many state websites also have listings of all of their local health departments. ° °3) Find a Walk-in Clinic °If your illness is not likely to be very severe or complicated, you may want to try a walk in clinic. These are popping up all over the country in pharmacies, drugstores, and shopping centers. They're usually staffed by nurse practitioners or physician assistants that have been trained to treat common   illnesses and complaints. They're usually fairly quick and inexpensive. However, if you have serious medical issues or chronic medical problems, these are probably not your best option. ° °No Primary Care Doctor: °- Call [facility] at  832-8000 - they can help you locate a primary care doctor that  accepts your insurance, provides certain services, etc. °- Physician Referral Service- 1-800-533-3463 ° °Chronic Pain Problems: °Organization         Address  Phone   Notes  °[facility]  (336) 297-2271 Patients need to be referred by their primary care doctor.   ° °Medication Assistance: °Organization         Address  Phone   Notes  °[place] [facility] [address] °[place], [place] [address] (336) 641-8030 --Must be a resident of [place] °-- Must have NO insurance coverage whatsoever (no Medicaid/ Medicare, etc.) °-- The pt. MUST have a primary care doctor that directs their care regularly and follows them in the community °  °[facility]  (866) 331-1348   °[employer]  (888) 892-1162   ° °Agencies that provide inexpensive medical care: °Organization         Address  Phone   Notes  °[facility]  (336) 832-8035   °[facility]    (336) 832-7272   °[facility] [address] °[place], [place] [address] (336) 832-4777   °[facility] [address], °[place] (336) 271-4999   °[facility]    (336) 373-0678   °[facility]    (336) 272-1050   °[facility] ° [address], [place] Phone:  (336) 832-4444, Fax:  (336) 832-4440 Hours of Operation:  9 am - 6 pm, M-F.  Also accepts Medicaid/Medicare and self-pay.  °[facility] ° [address], [place] Phone: (336) 832-3150, Fax: (336) 832-3151. Hours of Operation:  8:30 am - 5:30 pm, M-F.  Also accepts Medicaid and self-pay.  °[facility] [place] [address], [place] Phone: (336) 878-6027   °[facility] [address], [place], [place] (336)723-1848, Ext. 123 Mondays & Thursdays: 7-9 AM.  First 15 patients are seen on a first come, first serve basis. °  ° °Medicaid-accepting [place] Providers: ° °Organization         Address  Phone   Notes  °[facility] [address], [place] (336) 641-2100 Also accepts self-pay patients.  °[facility] [address], [place] ° (336) 856-9996   °[facility] [address], [place] (336) 288-8857   °[facility] [address] [place] [address], [place] (336) 299-7000   °Veita Bland [address], [place]  ° (336) 373-1557 Only accepts [place] Access Medicaid patients after they have their name applied to their card.  ° °Self-Pay (no insurance) in [place]: ° °Organization         Address  Phone   Notes  °Sickle Cell Patients, [facility] [address], [place] (336) 832-1970   °[facility] [address], [place] (336) 832-4400   °[facility] ° [address], [place] (336) 992-4800   °[facility]/Dr. Osei-Bonsu ° [address] [place] [address],  or [address],   [place] (336) 841-8500 Phone number for both [place] and [place] locations is the same.  °[facility] [address], [place] (336) 299-0000   °[facility] [address] [place] [address], [place] or [address] (336) 852-7530 °(336) 878-2260   °[facility] [address], [place] (336) 350-1642, phone; (336) 294-5005, fax Sees patients 1st and 3rd Saturday of every month.  Must not qualify for public or private insurance (i.e. Medicaid, Medicare, [place] Health Choice, Veterans' Benefits) • Household income should be no more than 200% of the poverty level •The clinic cannot treat you if you are pregnant or think you are pregnant • Sexually transmitted diseases are not treated at the clinic.  ° ° °Dental Care: °Organization         Address  Phone  Notes  °[facility] [address], [place] (336) 641-6152 Accepts children up to age 21 who are enrolled in Medicaid or [place] Health Choice; pregnant women with a Medicaid card; and children who have applied for Medicaid or [place] Health Choice, but were declined, whose parents can pay a reduced fee at time of service.    °[facility] [place]  [address], [place] (336) 641-7733 Accepts children up to age 21 who are enrolled in Medicaid or [place] Health Choice; pregnant women with a Medicaid card; and children who have applied for Medicaid or [place] Health Choice, but were declined, whose parents can pay a reduced fee at time of service.  °[facility] ° [address], [place] (336) 641-4533 Patients are seen by appointment only. Walk-ins are not accepted. [facility] will see patients 18 years of age and older. °Monday - Tuesday (8am-5pm) °Most Wednesdays (8:30-5pm) °$30 per visit, cash only  °[facility] ° [address], [place] (336) 641-4533 Patients are seen by appointment only. Walk-ins are not accepted. [facility] will see patients 18 years of age and older. °One Wednesday Evening (Monthly: Volunteer Based).  $30 per visit, cash only  °[facility]  (919) 537-3737 for adults; Children under age 4, call Graduate Pediatric Dentistry at ([phone]) [phone]. Children aged 4-14, please call ([phone]) [phone] to request a pediatric application. ° Dental services are provided in all areas of dental care including fillings, crowns and bridges, complete and partial dentures, implants, gum treatment, root canals, and extractions. Preventive care is also provided. Treatment is provided to both adults and children. °Patients are selected via a lottery and there is often a waiting list. °  °[facility] [address], °[place] ° (336) 763-8833 [email or web address] °  °[facility] [address], [place], [place] (336)723-1848, Ext. 123 Second and Fourth Thursday of each month, opens at 6:30 AM; Clinic ends at 9 AM.  Patients are seen on a first-come first-served basis, and a limited number are seen during each clinic.  ° °[facility] ° [address], [place], [place] (336)  723-7904   Eligibility Requirements °You must have lived in [place], [place], or [place] counties for at least the last three months. °  You cannot be eligible for state or federal sponsored healthcare insurance, including Veterans Administration, Medicaid, or Medicare. °  You generally cannot be eligible for healthcare insurance through your employer.  °  How to apply: °Eligibility screenings are held every Tuesday   and Wednesday afternoon from 1:00 pm until 4:00 pm. You do not need an appointment for the interview!  °[facility] [address], [place], [place] 336-631-2330   °[facility]  336-342-8273   °[facility]  336-703-3100   °[facility]  336-570-6415   ° °Behavioral Health Resources in the Community: °Intensive Outpatient Programs °Organization         Address  Phone  Notes  °[facility] [address], [place], [place] 336-878-6098   °[facility] [address], [place], [place] 336-832-9800   °[facility] [address], [place], [place] ° 336-882-2125   °[facility] [address],  °[place], [place] 1-800-853-5163 or 336-641-4981   °Substance Abuse Resources °Organization         Address  Phone  Notes  °[facility]  336-882-2125   °[facility]  336-784-9470   °[facility]  336-285-9073   °[facility]  336-845-3988   °Residential & Outpatient Substance Abuse Program  1-800-659-3381   °Psychological Services °Organization         Address  Phone  Notes  °[facility]  336- 832-9600   °[facility]  336- 378-7881   °[facility] [address], [place] 1-800-853-5163 or 336-641-4981   ° °Mobile Crisis Teams °Organization         Address  Phone  Notes  °[facility], Mobile Crisis Care Unit  1-877-626-1772   °Assertive °[facility] ° [address] [place], [place] 336-834-9664   °Sharon DeEsch [address] °[place] 336-554-5454   ° °Self-Help/Support Groups °Organization         Address  Phone             Notes  °[facility] [place] - variety of support groups  336- 373-1402 Call for more information  °Narcotics Anonymous (NA), [facility] [address], °[place]  2 meetings at this location  ° °Residential Treatment Programs °Organization         Address  Phone  Notes  °[facility] [address],    °[place]  1-866-801-8205   °[facility] ° [address], [place], [place] 704-293-8524   °[facility] [address], [place] 336-845-3988 Admissions: 8am-3pm M-F  °[facility] [address]    °[place], [place] 336-841-1104   °[facility] [address], [place], [place] 336-379-7146   °[facility] [address]  °[place], [place] 336-285-9073   °[facility] Intensive Outpatient [address], [place], [place] 336-852-3033   °[facility] [address]  °[place], [place] 1-877-615-2722 or 336-784-9470   °[facility] [address] [place], [place] 336-227-7417 Accepts Medicaid  °[facility] [address]  ° [place] 1-800-659-3381 Substance Abuse/Addiction Treatment  ° °[place] Behavioral Health Resources °Organization         Address  Phone  Notes  °[facility]  (888) 581-9988   °Julie Brannon, PhD [address] [place], [place]   (336) 349-5553 or (336) 951-0000   °[facility]   [address] °[place], [place] (336) 349-4454   °[facility] [address], [place], [place] (336) 342-8316 Insurance/Medicaid/sponsorship through [facility]  °[facility] [address]                                    [place], [place] (336) 342-8316  Therapy/tele-psych/case  °[facility] [address]  ° [place], [place] (336) 349-2233    °Dr. Arfeen  (336) 349-4544   °[facility] [place]  [employer] [place] [facility] 1) [address], [place] °2) [address], [place] °3)  [address], [place] (336) 349-3220 °(336) 342-7768 ° °(336) 342-8140   °[place] [facility] (336) 342-1394 or (336) 342-3537 (After Hours)    ° ° ° °

## 2015-01-27 ENCOUNTER — Emergency Department (HOSPITAL_COMMUNITY)
Admission: EM | Admit: 2015-01-27 | Discharge: 2015-01-27 | Disposition: A | Discharge status: Home / Self Care | Payer: Medicaid Other | Attending: Emergency Medicine | Admitting: Emergency Medicine

## 2015-01-27 ENCOUNTER — Emergency Department (HOSPITAL_COMMUNITY): Payer: Medicaid Other

## 2015-01-27 ENCOUNTER — Encounter (HOSPITAL_COMMUNITY): Payer: Self-pay | Admitting: Emergency Medicine

## 2015-01-27 DIAGNOSIS — Z862 Personal history of diseases of the blood and blood-forming organs and certain disorders involving the immune mechanism: Secondary | ICD-10-CM | POA: Insufficient documentation

## 2015-01-27 DIAGNOSIS — Y9389 Activity, other specified: Secondary | ICD-10-CM | POA: Insufficient documentation

## 2015-01-27 DIAGNOSIS — Y998 Other external cause status: Secondary | ICD-10-CM | POA: Insufficient documentation

## 2015-01-27 DIAGNOSIS — Z720 Tobacco use: Secondary | ICD-10-CM | POA: Insufficient documentation

## 2015-01-27 DIAGNOSIS — W01198A Fall on same level from slipping, tripping and stumbling with subsequent striking against other object, initial encounter: Secondary | ICD-10-CM | POA: Diagnosis not present

## 2015-01-27 DIAGNOSIS — Y9289 Other specified places as the place of occurrence of the external cause: Secondary | ICD-10-CM | POA: Insufficient documentation

## 2015-01-27 DIAGNOSIS — S8992XA Unspecified injury of left lower leg, initial encounter: Secondary | ICD-10-CM | POA: Insufficient documentation

## 2015-01-27 DIAGNOSIS — Z793 Long term (current) use of hormonal contraceptives: Secondary | ICD-10-CM | POA: Insufficient documentation

## 2015-01-27 DIAGNOSIS — M25562 Pain in left knee: Secondary | ICD-10-CM

## 2015-01-27 MED ORDER — HYDROCODONE-ACETAMINOPHEN 5-325 MG PO TABS: 1.0000 | ORAL_TABLET | ORAL | Status: DC | PRN

## 2015-01-27 MED ORDER — IBUPROFEN 800 MG PO TABS
800.0000 mg | ORAL_TABLET | Freq: Once | ORAL | Status: AC
  Administered 2015-01-27: 800 mg via ORAL
  Filled 2015-01-27: qty 1

## 2015-01-27 MED ORDER — IBUPROFEN 800 MG PO TABS: 800.0000 mg | ORAL_TABLET | Freq: Three times a day (TID) | ORAL | Status: DC

## 2015-01-27 NOTE — Discharge Instructions (Signed)
1. Medications: ibuprofen, vicodin, usual home medications 2. Treatment: rest, drink plenty of fluids, ice, elevate knee, use knee immobilizer and crutches, weight bearing as tolerated with crutches 3. Follow Up: please followup with orthopedics for discussion of your diagnoses and further evaluation after today's visit; if you do not have a primary care doctor use the resource guide provided to find one; please return to the ER for severe pain, numbness, tingling, new or worsening symptoms   Knee Pain The knee is the complex joint between your thigh and your lower leg. It is made up of bones, tendons, ligaments, and cartilage. The bones that make up the knee are:  The femur in the thigh.  The tibia and fibula in the lower leg.  The patella or kneecap riding in the groove on the lower femur. CAUSES  Knee pain is a common complaint with many causes. A few of these causes are:  Injury, such as:  A ruptured ligament or tendon injury.  Torn cartilage.  Medical conditions, such as:  Gout  Arthritis  Infections  Overuse, over training, or overdoing a physical activity. Knee pain can be minor or severe. Knee pain can accompany debilitating injury. Minor knee problems often respond well to self-care measures or get well on their own. More serious injuries may need medical intervention or even surgery. SYMPTOMS The knee is complex. Symptoms of knee problems can vary widely. Some of the problems are:  Pain with movement and weight bearing.  Swelling and tenderness.  Buckling of the knee.  Inability to straighten or extend your knee.  Your knee locks and you cannot straighten it.  Warmth and redness with pain and fever.  Deformity or dislocation of the kneecap. DIAGNOSIS  Determining what is wrong may be very straight forward such as when there is an injury. It can also be challenging because of the complexity of the knee. Tests to make a diagnosis may include:  Your caregiver  taking a history and doing a physical exam.  Routine X-rays can be used to rule out other problems. X-rays will not reveal a cartilage tear. Some injuries of the knee can be diagnosed by:  Arthroscopy a surgical technique by which a small video camera is inserted through tiny incisions on the sides of the knee. This procedure is used to examine and repair internal knee joint problems. Tiny instruments can be used during arthroscopy to repair the torn knee cartilage (meniscus).  Arthrography is a radiology technique. A contrast liquid is directly injected into the knee joint. Internal structures of the knee joint then become visible on X-ray film.  An MRI scan is a non X-ray radiology procedure in which magnetic fields and a computer produce two- or three-dimensional images of the inside of the knee. Cartilage tears are often visible using an MRI scanner. MRI scans have largely replaced arthrography in diagnosing cartilage tears of the knee.  Blood work.  Examination of the fluid that helps to lubricate the knee joint (synovial fluid). This is done by taking a sample out using a needle and a syringe. TREATMENT The treatment of knee problems depends on the cause. Some of these treatments are:  Depending on the injury, proper casting, splinting, surgery, or physical therapy care will be needed.  Give yourself adequate recovery time. Do not overuse your joints. If you begin to get sore during workout routines, back off. Slow down or do fewer repetitions.  For repetitive activities such as cycling or running, maintain your strength and nutrition.  Alternate muscle groups. For example, if you are a weight lifter, work the upper body on one day and the lower body the next.  Either tight or weak muscles do not give the proper support for your knee. Tight or weak muscles do not absorb the stress placed on the knee joint. Keep the muscles surrounding the knee strong.  Take care of mechanical  problems.  If you have flat feet, orthotics or special shoes may help. See your caregiver if you need help.  Arch supports, sometimes with wedges on the inner or outer aspect of the heel, can help. These can shift pressure away from the side of the knee most bothered by osteoarthritis.  A brace called an "unloader" brace also may be used to help ease the pressure on the most arthritic side of the knee.  If your caregiver has prescribed crutches, braces, wraps or ice, use as directed. The acronym for this is PRICE. This means protection, rest, ice, compression, and elevation.  Nonsteroidal anti-inflammatory drugs (NSAIDs), can help relieve pain. But if taken immediately after an injury, they may actually increase swelling. Take NSAIDs with food in your stomach. Stop them if you develop stomach problems. Do not take these if you have a history of ulcers, stomach pain, or bleeding from the bowel. Do not take without your caregiver's approval if you have problems with fluid retention, heart failure, or kidney problems.  For ongoing knee problems, physical therapy may be helpful.  Glucosamine and chondroitin are over-the-counter dietary supplements. Both may help relieve the pain of osteoarthritis in the knee. These medicines are different from the usual anti-inflammatory drugs. Glucosamine may decrease the rate of cartilage destruction.  Injections of a corticosteroid drug into your knee joint may help reduce the symptoms of an arthritis flare-up. They may provide pain relief that lasts a few months. You may have to wait a few months between injections. The injections do have a small increased risk of infection, water retention, and elevated blood sugar levels.  Hyaluronic acid injected into damaged joints may ease pain and provide lubrication. These injections may work by reducing inflammation. A series of shots may give relief for as long as 6 months.  Topical painkillers. Applying certain  ointments to your skin may help relieve the pain and stiffness of osteoarthritis. Ask your pharmacist for suggestions. Many over the-counter products are approved for temporary relief of arthritis pain.  In some countries, doctors often prescribe topical NSAIDs for relief of chronic conditions such as arthritis and tendinitis. A review of treatment with NSAID creams found that they worked as well as oral medications but without the serious side effects. PREVENTION  Maintain a healthy weight. Extra pounds put more strain on your joints.  Get strong, stay limber. Weak muscles are a common cause of knee injuries. Stretching is important. Include flexibility exercises in your workouts.  Be smart about exercise. If you have osteoarthritis, chronic knee pain or recurring injuries, you may need to change the way you exercise. This does not mean you have to stop being active. If your knees ache after jogging or playing basketball, consider switching to swimming, water aerobics, or other low-impact activities, at least for a few days a week. Sometimes limiting high-impact activities will provide relief.  Make sure your shoes fit well. Choose footwear that is right for your sport.  Protect your knees. Use the proper gear for knee-sensitive activities. Use kneepads when playing volleyball or laying carpet. Buckle your seat belt every time you  drive. Most shattered kneecaps occur in car accidents.  Rest when you are tired. SEEK MEDICAL CARE IF:  You have knee pain that is continual and does not seem to be getting better.  SEEK IMMEDIATE MEDICAL CARE IF:  Your knee joint feels hot to the touch and you have a high fever. MAKE SURE YOU:   Understand these instructions.  Will watch your condition.  Will get help right away if you are not doing well or get worse. Document Released: 03/02/2007 Document Revised: 07/28/2011 Document Reviewed: 03/02/2007 [facility] Patient Information 2015 [place], [place]. This  information is not intended to replace advice given to you by your health care provider. Make sure you discuss any questions you have with your health care provider.  Knee Bracing Knee braces are supports to help stabilize and protect an injured or painful knee. They come in many different styles. They should support and protect the knee without increasing the chance of other injuries to yourself or others. It is important not to have a false sense of security when using a brace. Knee braces that help you to keep using your knee:  Do not restore normal knee stability under high stress forces.  May decrease some aspects of athletic performance. Some of the different types of knee braces are:  Prophylactic knee braces are designed to prevent or reduce the severity of knee injuries during sports that make injury to the knee more likely.  Rehabilitative knee braces are designed to allow protected motion of:  Injured knees.  Knees that have been treated with or without surgery. There is no evidence that the use of a supportive knee brace protects the graft following a successful anterior cruciate ligament (ACL) reconstruction. However, braces are sometimes used to:   Protect injured ligaments.  Control knee movement during the initial healing period. They may be used as part of the treatment program for the various injured ligaments or cartilage of the knee including the:  Anterior cruciate ligament.  Medial collateral ligament.  Medial or lateral cartilage (meniscus).  Posterior cruciate ligament.  Lateral collateral ligament. Rehabilitative knee braces are most commonly used:  During crutch-assisted walking right after injury.  During crutch-assisted walking right after surgery to repair the cartilage and/or cruciate ligament injury.  For a short period of time, 2-8 weeks, after the injury or surgery. The value of a rehabilitative brace as opposed to a cast or splint includes  the:  Ability to adjust the brace for swelling.  Ability to remove the brace for examinations, icing, or showering.  Ability to allow for movement in a controlled range of motion. Functional knee braces give support to knees that have already been injured. They are designed to provide stability for the injured knee and provide protection after repair. Functional knee braces may not affect performance much. Lower extremity muscle strengthening, flexibility, and improvement in technique are more important than bracing in treating ligamentous knee injuries. Functional braces are not a substitute for rehabilitation or surgical procedures. Unloader/off-loader braces are designed to provide pain relief in arthritic knees. Patients with wear and tear arthritis from growing old or from an old cartilage injury (osteoarthritis) of the knee, and bowlegged (varus) or knock-knee (valgus) deformities, often develop increased pain in the arthritic side due to increased loading. Unloader/off-loader braces are made to reduce uneven loading in such knees. There is reduction in bowing out movement in bowlegged knees when the correct unloader brace is used. Patients with advanced osteoarthritis or severe varus or valgus alignment  problems would not likely benefit from bracing. Patellofemoral braces help the kneecap to move smoothly and well centered over the end of the femur in the knee.  Most people who wear knee braces feel that they help. However, there is a lack of scientific evidence that knee braces are helpful at the level needed for athletic participation to prevent injury. In spite of this, athletes report an increase in knee stability, pain relief, performance improvement, and confidence during athletics when using a brace.  Different knee problems require different knee braces:  Your caregiver may suggest one kind of knee brace after knee surgery.  A caregiver may choose another kind of knee brace for support  instead of surgery for some types of torn ligaments.  You may also need one for pain in the front of your knee that is not getting better with strengthening and flexibility exercises. Get your caregiver's advice if you want to try a knee brace. The caregiver will advise you on where to get them and provide a prescription when it is needed to fashion and/or fit the brace. Knee braces are the least important part of preventing knee injuries or getting better following injury. Stretching, strengthening and technique improvement are far more important in caring for and preventing knee injuries. When strengthening your knee, increase your activities a little at a time so as not to develop injuries from overuse. Work out an exercise plan with your caregiver and/or physical therapist to get the best program for you. Do not let a knee brace become a crutch. Always remember, there are no braces which support the knee as well as your original ligaments and cartilage you were born with. Conditioning, proper warm-up, and stretching remain the most important parts of keeping your knees healthy. HOW TO USE A KNEE BRACE  During sports, knee braces should be used as directed by your caregiver.  Make sure that the hinges are where the knee bends.  Straps, tapes, or hook-and-loop tapes should be fastened around your leg as instructed.  You should check the placement of the brace during activities to make sure that it has not moved. Poorly positioned braces can hurt rather than help you.  To work well, a knee brace should be worn during all activities that put you at risk of knee injury.  Warm up properly before beginning athletic activities. HOME CARE INSTRUCTIONS  Knee braces often get damaged during normal use. Replace worn-out braces for maximum benefit.  Clean regularly with soap and water.  Inspect your brace often for wear and tear.  Cover exposed metal to protect others from injury.  Durable  materials may cost more, but last longer. SEEK IMMEDIATE MEDICAL CARE IF:   Your knee seems to be getting worse rather than better.  You have increasing pain or swelling in the knee.  You have problems caused by the knee brace.  You have increased swelling or inflammation (redness or soreness) in your knee.  Your knee becomes warm and more painful and you develop an unexplained temperature over 101F (38.3C). MAKE SURE YOU:   Understand these instructions.  Will watch your condition.  Will get help right away if you are not doing well or get worse. See your caregiver, physical therapist, or orthopedic surgeon for additional information. Document Released: 07/26/2003 Document Revised: 09/19/2013 Document Reviewed: 11/01/2008 [facility] Patient Information 2015 [place], [place]. This information is not intended to replace advice given to you by your health care provider. Make sure you discuss any questions you  have with your health care provider.   Emergency Department Resource Guide 1) Find a Doctor and Pay Out of Pocket Although you won't have to find out who is covered by your insurance plan, it is a good idea to ask around and get recommendations. You will then need to call the office and see if the doctor you have chosen will accept you as a new patient and what types of options they offer for patients who are self-pay. Some doctors offer discounts or will set up payment plans for their patients who do not have insurance, but you will need to ask so you aren't surprised when you get to your appointment.  2) Contact Your Local Health Department Not all health departments have doctors that can see patients for sick visits, but many do, so it is worth a call to see if yours does. If you don't know where your local health department is, you can check in your phone book. The CDC also has a tool to help you locate your state's health department, and many state websites also have listings of  all of their local health departments.  3) Find a Walk-in Clinic If your illness is not likely to be very severe or complicated, you may want to try a walk in clinic. These are popping up all over the country in pharmacies, drugstores, and shopping centers. They're usually staffed by nurse practitioners or physician assistants that have been trained to treat common illnesses and complaints. They're usually fairly quick and inexpensive. However, if you have serious medical issues or chronic medical problems, these are probably not your best option.  No Primary Care Doctor: - Call Health Connect at  [phone] - they can help you locate a primary care doctor that  accepts your insurance, provides certain services, etc. - Physician Referral Service- [phone]  Chronic Pain Problems: Organization         Address  Phone   Notes  [facility]  [phone] Patients need to be referred by their primary care doctor.   Medication Assistance: Organization         Address  Phone   Notes  [facility] Medication [facility] [address] [place] [place], [place] [address] [phone] --Must be a resident of [facility] -- Must have NO insurance coverage whatsoever (no Medicaid/ Medicare, etc.) -- The pt. MUST have a primary care doctor that directs their care regularly and follows them in the community   [facility]  [phone]   [employer]  [phone]    Agencies that provide inexpensive medical care: Organization         Address  Phone   Notes  [facility]  [phone]   [facility]    [phone]   [facility] [address] [place], [place] [address] [phone]   [facility] [address] [place] [phone]   [facility]anned Parenthood    [phone]   [facility]    [phone]   [facility]  [address],  [place] Phone:  [phone], Fax:  [phone] Hours of Operation:  9 am - 6 pm, M-F.  Also accepts Medicaid/Medicare and self-pay.  [facility]  [address], [place] Phone: [phone], Fax: [phone]. Hours of Operation:  8:30 am - 5:30 pm, M-F.  Also  accepts Medicaid and self-pay.  [facility] [place] [address] [place] Phone: [phone]   [facility] [address] Natasha Bence [place], [place] [phone], Ext. [phone] Mondays & Thursdays: 7-9 AM.  First 15 patients are seen on a first come, first serve basis.    Medicaid-accepting [facility] Providers:  Organization         Address  Phone   Notes  [facility] [address], [place] [phone] Also accepts self-pay patients.  [facility] [address] Laurell Josephs [place], [place]  [phone]   [facility] [address], [place] [phone]   [facility] [address] [place] [phone]   Renaye Rakers [address], [place]   [phone] Only accepts [place] patients after they have their name applied to their card.   Self-Pay (no insurance) in [facility]:  Organization         Address  Phone   Notes  Sickle Cell Patients, [facility] [address] [place], [place] [phone]   [facility] [address] [place], [place] [phone]   [facility] [place]  [address], [place] [phone]   [facility]/Dr. Osei-Bonsu  [address] [place] or [address], [place] [phone] Phone number for both [place] and [place] locations is the same.  [facility] and [facility] [address], [place] [phone]   [facility] [address], [place] or [address] [phone]   [facility] [address], [place] [phone], phone; [phone], fax Sees patients 1st and 3rd Saturday of every month.  Must not qualify for public or private insurance (i.e. Medicaid, Medicare, [place] Health Choice, Veterans' Benefits)  Household income should be no more than 200% of the poverty level The clinic cannot treat you if you are pregnant or think you are pregnant  Sexually transmitted diseases are not treated at the clinic.    Dental Care: Organization         Address  Phone  Notes  [facility] Department of [facility] [address] [place], [place] [phone] Accepts children up to age 19 who are enrolled in [place] or [place] Health Choice; pregnant women with a Medicaid card; and children who have applied for Medicaid or [place] Health Choice, but were declined, whose parents can pay a reduced fee at time of service.  [facility] Department of [facility]  [address], [place] [phone] Accepts children up to age 54 who are enrolled in [place] or [place] Health Choice; pregnant women with a Medicaid card; and children who have applied for Medicaid or [place] Health Choice, but were declined, whose parents can pay a reduced fee at time of service.  [facility]  [address] [place], [place] [phone] Patients are seen by appointment only. Walk-ins are not accepted. [facility] will see patients 22 years of age and older. Monday - Tuesday (8am-5pm) Most Wednesdays (8:30-5pm) $30 per visit, cash only  [facility]  [address], [facility] [phone] Patients are seen by appointment only. Walk-ins are not accepted. [facility] will see  patients 33 years of age and older. One Wednesday Evening (Monthly: Volunteer Based).  $30 per visit, cash only  [employer]   [phone] for adults; Children under age 41, call Graduate Pediatric Dentistry at [phone]. Children aged 69-14, please call [phone] to request a pediatric application.  Dental services are provided in all areas of dental care including fillings, crowns and bridges, complete and partial dentures, implants, gum treatment, root canals, and extractions. Preventive care is also provided. Treatment is provided to both adults and children. Patients are selected via a lottery and there is often a waiting list.   [facility] [address], [place]  [phone] [email or web address]   [facility] [address] [place], [place] [phone], Ext. [phone] Second and Fourth Thursday of each month, opens at 6:30 AM; Clinic ends at 9 AM.  Patients are seen on a first-come first-served basis, and a limited number are seen during each clinic.   [facility]  [address] Ether Griffins [place], [place] [phone]   Eligibility Requirements You must have lived in [place], [place], or [place] counties for at least the last three months.   You cannot be eligible for state or federal sponsored [place], including [employer], [place], or [employer].   You generally cannot be eligible for healthcare insurance through your employer.    How to apply: Eligibility screenings are held every Tuesday and Wednesday afternoon from 1:00 pm until 4:00 pm. You do not need an appointment for the interview!  [facility] [address], [place], [place] [phone]   [facility]  [phone]   [facility] Health Department  [phone]   [facility] Department  [phone]    Behavioral Health Resources in the Community: Intensive Outpatient Programs Organization         Address  Phone  Notes  [facility] [address] [place], [place] [phone]   [facility] [address] [place], [place] [phone]   [facility] [address] [place], [place]  [phone]   [facility] [address],  [place], [place] [phone] or [phone]   Substance Abuse Resources Organization         Address  Phone  Notes  [facility]  [phone]   [facility]  [phone]   [facility]  [phone]   Floydene Flock  [phone]   Residential [facility] Outpatient [facility]stance Abuse Program  [phone]   Psychological Services Organization         Address  Phone  Notes  [facility]  [phone]   [facility]  [phone]   [facility] [address] [place] [phone] or [phone]    Mobile Crisis Teams Organization         Address  Phone  Notes  [facility], Mobile Crisis Care Unit  [phone]   [facility]  [address] [place], [place] [phone]   Doristine Locks [address] [place] [phone]    Self-Help/Support Groups Organization         Address  Phone             Notes  [facility] [place] - variety of support groups  [phone] Call for more information  Narcotics Anonymous (NA), [facility] [address], [employer]  [place]  2 meetings at this location   Residential Treatment Programs Organization         Address  Phone  Notes  [facility] [address],    [place]  [phone]   [facility]  [address], [place] [address], [place], [place] [phone]   [facility] [address] [place], [place] [phone] Admissions: 8am-3pm M-F  [facility] [address],    [place], [place] [phone]   [facility] [address] [place], [place], [place] [phone]   [facility] [address]  [place], [place] [phone]     [facility] - Intensive Outpatient [address], [place], [place] [phone]   [facility] ([facility] [address] [place]  [place], [place] [phone] or [phone]   [facility] ([facility] [address] [place], [place] [phone] Accepts Medicaid  [facility] [address],  [place] [phone] Substance Abuse/Addiction Treatment   [facility] Organization         Address  Phone  Notes  [facility]  [phone]   Angie Fava, PhD [address] [place] Knack [place], [place]   [phone] or [phone]   [facility] Behavioral   [address] [place], [place] [phone]   [facility] [address], [place], [place] [phone] Insurance/Medicaid/sponsorship through [facility] and Families [address]                                    [place], [place] [phone] Therapy/tele-psych/case  [facility] [address], [place] [phone]    Dr. Lolly Mustache  [phone]   [facility]  [employer] [facility] Dept. 1) [address] [place] 2) [address], [place] 3)  [address], [place] [phone]  [phone]   [facility] Child Abuse Hotline 984-096-5726 or [phone] (After Hours)

## 2015-01-27 NOTE — ED Notes (Signed)
Patient reports that she slipped and twisted her left knee, but did not land on it while at home at approximately midnight. Rates pain 8/10 on 0-10 pain scale.

## 2015-01-27 NOTE — ED Notes (Signed)
C/o L knee pain after slipping and falling on it approx 5 hours ago.  CMS intact.

## 2015-01-27 NOTE — ED Provider Notes (Signed)
CSN: [id number]     Arrival date & time 01/27/15  4696 History   First MD Initiated Contact with Patient 01/27/15 [phone]     Chief Complaint  Patient presents with  . Knee Pain  . Fall     HPI   Alicia [name] is a 39 y.o. female with no pertinent PMH who presents to the ED with left knee pain s/p fall. She reports she fell approximately 5 hours ago after slipping on dirt on her front porch and has had pain to her left knee since that time. She states she did not land on her knee. She denies hitting her head or LOC. She reports constant pain, exacerbated by movement. She has not tried anything for symptom relief. She denies numbness, paresthesia. She reports decreased range of motion of left lower extremity due to pain.     Past Medical History  Diagnosis Date  . No pertinent past medical history   . Anemia    Past Surgical History  Procedure Laterality Date  . Foot surgery    . Cesarean section    . Incision and drainage intra oral abscess Right 07/06/2012   Family History  Problem Relation Age of Onset  . Other Neg Hx   . Diabetes Paternal Grandmother   . Congenital heart disease Son   . Hypertension Mother    Social History  Substance Use Topics  . Smoking status: Current Every Day Smoker -- 0.50 packs/day    Types: Cigarettes  . Smokeless tobacco: Never Used     Comment: Patient stopped smoking when she found out she was pregnant  . Alcohol Use: No   OB History    Gravida Para Term Preterm AB TAB SAB Ectopic Multiple Living   7 6 5 1 1  1   6       Review of Systems  Constitutional: Negative for fever and chills.  Respiratory: Negative for shortness of breath.   Cardiovascular: Negative for chest pain and leg swelling.  Gastrointestinal: Negative for nausea, vomiting, abdominal pain, diarrhea and constipation.  Musculoskeletal: Positive for joint swelling, arthralgias and gait problem. Negative for myalgias, back pain, neck pain and neck stiffness.   Reports pain with ambulation.  Skin: Negative for color change, pallor, rash and wound.  Neurological: Negative for dizziness, syncope, weakness, light-headedness, numbness and headaches.  All other systems reviewed and are negative.     Allergies  Review of patient's allergies indicates no known allergies.  Home Medications   Prior to Admission medications   Medication Sig Start Date End Date Taking? Authorizing Provider  medroxyPROGESTERone (DEPO-PROVERA) 150 MG/ML injection Inject 150 mg into the muscle every 3 (three) months.    Historical Provider, MD  pantoprazole (PROTONIX) 20 MG tablet Take 2 tablets (40 mg total) by mouth daily. 12/19/14   Alvira Monday, MD  phenazopyridine (PYRIDIUM) 200 MG tablet Take 1 tablet (200 mg total) by mouth 3 (three) times daily as needed for pain. Patient not taking: Reported on 12/18/2014 02/18/14   Beverely Risen, MD    LMP 01/06/2015 Physical Exam  Constitutional: She is oriented to person, place, and time. She appears well-developed and well-nourished. No distress.  HENT:  Head: Normocephalic and atraumatic.  Right Ear: External ear normal.  Left Ear: External ear normal.  Nose: Nose normal.  Mouth/Throat: Uvula is midline, oropharynx is clear and moist and mucous membranes are normal.  Eyes: Conjunctivae, EOM and lids are normal. Pupils are equal, round, and reactive to light. Right  eye exhibits no discharge. Left eye exhibits no discharge. No scleral icterus.  Neck: Normal range of motion. Neck supple.  Cardiovascular: Normal rate, regular rhythm, intact distal pulses and normal pulses.   Pulmonary/Chest: Effort normal and breath sounds normal. No respiratory distress.  Abdominal: Soft. Normal appearance. She exhibits no distension. There is no rigidity.  Musculoskeletal: She exhibits tenderness. She exhibits no edema.       Left knee: She exhibits decreased range of motion, swelling and effusion. She exhibits no ecchymosis, no deformity, no  erythema, no LCL laxity and no MCL laxity. Tenderness found. Medial joint line tenderness noted.  Decreased range of motion of left knee due to pain. TTP over medial and posterior aspects of left knee.  Neurological: She is alert and oriented to person, place, and time. She has normal strength. No sensory deficit.  Skin: Skin is warm, dry and intact. No rash noted. She is not diaphoretic. No erythema. No pallor.  Psychiatric: She has a normal mood and affect. Her speech is normal and behavior is normal. Judgment and thought content normal.  Nursing note and vitals reviewed.   ED Course  Procedures (including critical care time)  Labs Review Labs Reviewed - No data to display  Imaging Review Dg Knee Complete 4 Views Left  01/27/2015   CLINICAL DATA:  Slip and fall injury last night.  Left knee pain.  EXAM: LEFT KNEE - COMPLETE 4+ VIEW  COMPARISON:  None.  FINDINGS: Moderate size left knee effusion. No evidence of acute fracture or dislocation. No focal bone lesion or bone destruction. Bone cortex and trabecular architecture appear intact.  IMPRESSION: Moderate size left knee effusion.  No acute fractures demonstrated.   Electronically Signed   By: Burman Nieves M.D.   On: 01/27/2015 06:42     I have personally reviewed and evaluated these images as part of my medical decision-making.   EKG Interpretation None      MDM   Final diagnoses:  Left knee pain    39 year old female presents with left knee pain s/p mechanical fall early this morning. Denies hitting her knee. Denies additional injury, hitting her head, or LOC. Reports constant pain and swelling to her left knee. Denies numbness, paresthesia. Reports decreased range of motion due to pain.  Vital signs stable. Patient given ibuprofen for pain control. TTP over medial and posterior aspects of left knee. No MCL or LCL laxity; posterior and anterior drawer testing within normal limits. Decreased range of motion of left knee due  to pain. Decreased strength of left lower extremity due to pain. Sensation intact. Distal pulses intact. Compartments soft.  Imaging of left knee obtained; x-ray demonstrates moderate left knee effusion, no evidence of acute fracture or dislocation. Will give crutches and place knee immobilizer given difficulty with ambulation and significant pain. Given ibuprofen and pain medicine. Patient to follow-up with orthopedics for further evaluation and management. Return precautions discussed.  BP 109/70 mmHg  Pulse 86  Resp 18  Ht  (1.575 m)  SpO2 100%  LMP 01/06/2015         Mady Gemma, PA-C 01/27/15 1756  Derwood Kaplan, MD 01/29/15 [phone]

## 2015-01-27 NOTE — Progress Notes (Signed)
Orthopedic Tech Progress Note Patient Details:  Alicia [name] Aug 17, 1975 [id number]  Ortho Devices Type of Ortho Device: Crutches, Knee Immobilizer Ortho Device/Splint Interventions: Application   Cammer, Mickie Bail 01/27/2015, 8:29 AM

## 2015-10-26 ENCOUNTER — Encounter: Payer: Self-pay | Admitting: Obstetrics & Gynecology

## 2015-10-26 ENCOUNTER — Other Ambulatory Visit (HOSPITAL_COMMUNITY)
Admission: RE | Admit: 2015-10-26 | Discharge: 2015-10-26 | Disposition: A | Discharge status: Home / Self Care | Payer: Medicaid Other | Source: Ambulatory Visit | Attending: Obstetrics & Gynecology | Admitting: Obstetrics & Gynecology

## 2015-10-26 ENCOUNTER — Ambulatory Visit (INDEPENDENT_AMBULATORY_CARE_PROVIDER_SITE_OTHER): Payer: Medicaid Other | Admitting: Obstetrics & Gynecology

## 2015-10-26 VITALS — BP 138/91 | HR 78 | Wt 152.7 lb

## 2015-10-26 DIAGNOSIS — Z124 Encounter for screening for malignant neoplasm of cervix: Secondary | ICD-10-CM | POA: Diagnosis not present

## 2015-10-26 DIAGNOSIS — Z113 Encounter for screening for infections with a predominantly sexual mode of transmission: Secondary | ICD-10-CM | POA: Insufficient documentation

## 2015-10-26 DIAGNOSIS — Z01419 Encounter for gynecological examination (general) (routine) without abnormal findings: Secondary | ICD-10-CM

## 2015-10-26 DIAGNOSIS — Z0000 Encounter for general adult medical examination without abnormal findings: Secondary | ICD-10-CM | POA: Diagnosis not present

## 2015-10-26 NOTE — Progress Notes (Signed)
Subjective:    Alicia GowdaMartha S [name] is a 40 y.o. SAAP6 female who presents for an annual exam. The patient has no complaints today. The patient is sexually active. GYN screening history: last pap: was normal. Last pap smear was on 02/09/13.The patient wears seatbelts: yes. The patient participates in regular exercise: no. Has the patient ever been transfused or tattooed?: no. The patient reports that there is not domestic violence in her life.   Menstrual History: OB History    Gravida Para Term Preterm AB TAB SAB Ectopic Multiple Living   7 6 5 1 1  1   6       Menarche age: [age]  No LMP recorded.    The following portions of the patient's history were reviewed and updated as appropriate: allergies, current medications, past family history, past medical history, past social history, past surgical history and problem list.  Review of Systems A comprehensive review of systems was negative.  She has a partner that she visits occasionally for sex. She uses condoms, declines other forms of contraception.   Objective:    BP 138/91 mmHg  Pulse 78  Wt 152 lb 11.2 oz (69.264 kg)  General Appearance:    Alert, cooperative, no distress, appears stated age  Head:    Normocephalic, without obvious abnormality, atraumatic  Eyes:    PERRL, conjunctiva/corneas clear, EOM's intact, fundi    benign, both eyes  Ears:    Normal TM's and external ear canals, both ears  Nose:   Nares normal, septum midline, mucosa normal, no drainage    or sinus tenderness  Throat:   Lips, mucosa, and tongue normal; teeth and gums normal  Neck:   Supple, symmetrical, trachea midline, no adenopathy;    thyroid:  no enlargement/tenderness/nodules; no carotid   bruit or JVD  Back:     Symmetric, no curvature, ROM normal, no CVA tenderness  Lungs:     Clear to auscultation bilaterally, respirations unlabored  Chest Wall:    No tenderness or deformity   Heart:    Regular rate and rhythm, S1 and S2 normal, no murmur, rub   or  gallop  Breast Exam:    No tenderness, masses, or nipple abnormality  Abdomen:     Soft, non-tender, bowel sounds active all four quadrants,    no masses, no organomegaly  Genitalia:    Normal female without lesion, discharge or tenderness     Extremities:   Extremities normal, atraumatic, no cyanosis or edema  Pulses:   2+ and symmetric all extremities  Skin:   Skin color, texture, turgor normal, no rashes or lesions  Lymph nodes:   Cervical, supraclavicular, and axillary nodes normal  Neurologic:   CNII-XII intact, normal strength, sensation and reflexes    throughout  .    Assessment:    Healthy female exam.    Plan:     Thin prep Pap smear. with cotesting STI testing

## 2015-10-26 NOTE — Addendum Note (Signed)
Addended by: Jakolby Sedivy L on: 10/26/2015 11:25 AM   Modules accepted: Orders  

## 2015-10-27 LAB — WET PREP, GENITAL
Trich, Wet Prep: NONE SEEN
Yeast Wet Prep HPF POC: NONE SEEN

## 2015-10-29 LAB — POCT URINALYSIS DIP (DEVICE)
BILIRUBIN URINE: NEGATIVE
Glucose, UA: NEGATIVE mg/dL
Ketones, ur: NEGATIVE mg/dL
LEUKOCYTES UA: NEGATIVE
Nitrite: POSITIVE — AB
Protein, ur: NEGATIVE mg/dL
Specific Gravity, Urine: 1.02 (ref 1.005–1.030)
Urobilinogen, UA: 0.2 mg/dL (ref 0.0–1.0)
pH: 5.5 (ref 5.0–8.0)

## 2015-10-29 LAB — CYTOLOGY - PAP

## 2015-11-01 ENCOUNTER — Telehealth: Payer: Self-pay | Admitting: General Practice

## 2015-11-01 DIAGNOSIS — B9689 Other specified bacterial agents as the cause of diseases classified elsewhere: Secondary | ICD-10-CM

## 2015-11-01 DIAGNOSIS — N760 Acute vaginitis: Principal | ICD-10-CM

## 2015-11-01 MED ORDER — METRONIDAZOLE 500 MG PO TABS: 500.0000 mg | ORAL_TABLET | Freq: Two times a day (BID) | ORAL | Status: DC

## 2015-11-01 NOTE — Telephone Encounter (Signed)
Telephone call to patient regarding wet prep results & medication sent to pharmacy. No answer- unable to leave message.

## 2015-11-13 NOTE — Telephone Encounter (Signed)
Second attempt made to reach patient concerning test results.

## 2015-12-24 ENCOUNTER — Encounter (HOSPITAL_COMMUNITY): Payer: Self-pay | Admitting: Vascular Surgery

## 2015-12-24 ENCOUNTER — Emergency Department (HOSPITAL_COMMUNITY)
Admission: EM | Admit: 2015-12-24 | Discharge: 2015-12-24 | Disposition: A | Discharge status: Home / Self Care | Payer: Medicaid Other | Attending: Emergency Medicine | Admitting: Emergency Medicine

## 2015-12-24 DIAGNOSIS — J020 Streptococcal pharyngitis: Secondary | ICD-10-CM | POA: Insufficient documentation

## 2015-12-24 DIAGNOSIS — Z79899 Other long term (current) drug therapy: Secondary | ICD-10-CM | POA: Insufficient documentation

## 2015-12-24 DIAGNOSIS — F17210 Nicotine dependence, cigarettes, uncomplicated: Secondary | ICD-10-CM | POA: Insufficient documentation

## 2015-12-24 DIAGNOSIS — J029 Acute pharyngitis, unspecified: Secondary | ICD-10-CM | POA: Diagnosis present

## 2015-12-24 LAB — CBC
HEMATOCRIT: 40.8 % (ref 36.0–46.0)
HEMOGLOBIN: 13.4 g/dL (ref 12.0–15.0)
MCH: 30.7 pg (ref 26.0–34.0)
MCHC: 32.8 g/dL (ref 30.0–36.0)
MCV: 93.6 fL (ref 78.0–100.0)
Platelets: 231 10*3/uL (ref 150–400)
RBC: 4.36 MIL/uL (ref 3.87–5.11)
RDW: 12.9 % (ref 11.5–15.5)
WBC: 10.8 10*3/uL — ABNORMAL HIGH (ref 4.0–10.5)

## 2015-12-24 LAB — RAPID STREP SCREEN (MED CTR MEBANE ONLY): Streptococcus, Group A Screen (Direct): POSITIVE — AB

## 2015-12-24 LAB — COMPREHENSIVE METABOLIC PANEL
ALBUMIN: 3.7 g/dL (ref 3.5–5.0)
ALK PHOS: 72 U/L (ref 38–126)
ALT: 17 U/L (ref 14–54)
AST: 27 U/L (ref 15–41)
Anion gap: 12 (ref 5–15)
BILIRUBIN TOTAL: 0.4 mg/dL (ref 0.3–1.2)
BUN: 5 mg/dL — ABNORMAL LOW (ref 6–20)
CO2: 25 mmol/L (ref 22–32)
Calcium: 9.1 mg/dL (ref 8.9–10.3)
Chloride: 102 mmol/L (ref 101–111)
Creatinine, Ser: 0.83 mg/dL (ref 0.44–1.00)
GFR calc Af Amer: >60 mL/min (ref >60)
GLUCOSE: 89 mg/dL (ref 65–99)
POTASSIUM: 3.4 mmol/L — AB (ref 3.5–5.1)
Sodium: 139 mmol/L (ref 135–145)
Total Protein: 7.1 g/dL (ref 6.5–8.1)

## 2015-12-24 MED ORDER — ACETAMINOPHEN 325 MG PO TABS
650.0000 mg | ORAL_TABLET | Freq: Once | ORAL | Status: AC
  Administered 2015-12-24: 650 mg via ORAL

## 2015-12-24 MED ORDER — PENICILLIN G BENZATHINE 1200000 UNIT/2ML IM SUSP
1.2000 10*6.[IU] | Freq: Once | INTRAMUSCULAR | Status: AC
  Administered 2015-12-24: 1.2 10*6.[IU] via INTRAMUSCULAR
  Filled 2015-12-24: qty 2

## 2015-12-24 MED ORDER — ACETAMINOPHEN 325 MG PO TABS
ORAL_TABLET | ORAL | Status: AC
  Filled 2015-12-24: qty 2

## 2015-12-24 NOTE — ED Provider Notes (Signed)
[facility]-EMERGENCY DEPT Provider Note   CSN: [id number] Arrival date & time: 12/24/15  1633  First Provider Contact:  First MD Initiated Contact with Patient 12/24/15 2024        History   Chief Complaint Chief Complaint  Patient presents with  . Sore Throat  . Headache    HPI Alicia GowdaMartha S [name] is a 40 y.o. female.  Patient presents to the ED with a chief complaint of sore throat.  She states that the symptoms started 4 days ago.  Her child was sick with sore throat recently.  She reports associated fever.  Denies any cough or congestion.  Has not tried taking anything for the symptoms.  There are no modifying factors.   The history is provided by the patient. No language interpreter was used.    Past Medical History:  Diagnosis Date  . Anemia   . No pertinent past medical history     Patient Active Problem List   Diagnosis Date Noted  . VBAC, delivered 02/09/2013  . BV (bacterial vaginosis) 09/02/2012  . Skin abscess 07/03/2012    Past Surgical History:  Procedure Laterality Date  . CESAREAN SECTION    . FOOT SURGERY    . INCISION AND DRAINAGE INTRA ORAL ABSCESS Right 07/06/2012    OB History    Gravida Para Term Preterm AB Living   7 6 5 1 1 6    SAB TAB Ectopic Multiple Live Births   1       5       Home Medications    Prior to Admission medications   Medication Sig Start Date End Date Taking? Authorizing Provider  HYDROcodone-acetaminophen (NORCO/VICODIN) 5-325 MG per tablet Take 1 tablet by mouth every 4 (four) hours as needed. Patient not taking: Reported on 10/26/2015 01/27/15   Mady GemmaElizabeth C [place], PA-C  ibuprofen (ADVIL,MOTRIN) 800 MG tablet Take 1 tablet (800 mg total) by mouth 3 (three) times daily. Patient not taking: Reported on 10/26/2015 01/27/15   Mady GemmaElizabeth C [place], PA-C  medroxyPROGESTERone (DEPO-PROVERA) 150 MG/ML injection Inject 150 mg into the muscle every 3 (three) months. Reported on 10/26/2015    Historical Provider, MD  metroNIDAZOLE  (FLAGYL) 500 MG tablet Take 1 tablet (500 mg total) by mouth 2 (two) times daily. 11/01/15   Allie BossierMyra C Dove, MD  pantoprazole (PROTONIX) 20 MG tablet Take 2 tablets (40 mg total) by mouth daily. Patient not taking: Reported on 01/27/2015 12/19/14   Alvira MondayErin Schlossman, MD  phenazopyridine (PYRIDIUM) 200 MG tablet Take 1 tablet (200 mg total) by mouth 3 (three) times daily as needed for pain. Patient not taking: Reported on 12/18/2014 02/18/14   Beverely Risenennis Carr, MD    Family History Family History  Problem Relation Age of Onset  . Diabetes Paternal Grandmother   . Congenital heart disease Son   . Hypertension Mother   . Other Neg Hx     Social History Social History  Substance Use Topics  . Smoking status: Current Every Day Smoker    Packs/day: 0.50    Types: Cigarettes  . Smokeless tobacco: Never Used     Comment: Patient stopped smoking when she found out she was pregnant  . Alcohol use No     Allergies   Review of patient's allergies indicates no known allergies.   Review of Systems Review of Systems  HENT: Positive for sore throat.   All other systems reviewed and are negative.    Physical Exam Updated Vital Signs BP 126/94 (BP Location:  Right Arm)   Pulse 88   Temp 98.3 F (36.8 C) (Oral)   Resp 16   SpO2 98%   Physical Exam  Constitutional: She is oriented to person, place, and time. She appears well-developed and well-nourished.  HENT:  Head: Normocephalic and atraumatic.  Oropharynx is erythematous No abscess Uvula is midline Airway intact  Eyes: Conjunctivae and EOM are normal. Pupils are equal, round, and reactive to light.  Neck: Normal range of motion. Neck supple.  No meningismus   Cardiovascular: Normal rate and regular rhythm.  Exam reveals no gallop and no friction rub.   No murmur heard. Pulmonary/Chest: Effort normal and breath sounds normal. No respiratory distress. She has no wheezes. She has no rales. She exhibits no tenderness.  Abdominal: Soft. Bowel  sounds are normal. She exhibits no distension and no mass. There is no tenderness. There is no rebound and no guarding.  Musculoskeletal: Normal range of motion. She exhibits no edema or tenderness.  Neurological: She is alert and oriented to person, place, and time.  Skin: Skin is warm and dry.  Psychiatric: She has a normal mood and affect. Her behavior is normal. Judgment and thought content normal.  Nursing note and vitals reviewed.    ED Treatments / Results  Labs (all labs ordered are listed, but only abnormal results are displayed) Labs Reviewed  RAPID STREP SCREEN (NOT AT [facility]) - Abnormal; Notable for the following:       Result Value   Streptococcus, Group A Screen (Direct) POSITIVE (*)    All other components within normal limits  COMPREHENSIVE METABOLIC PANEL - Abnormal; Notable for the following:    Potassium 3.4 (*)    BUN 5 (*)    All other components within normal limits  CBC - Abnormal; Notable for the following:    WBC 10.8 (*)    All other components within normal limits    EKG  EKG Interpretation None       Radiology No results found.  Procedures Procedures (including critical care time)  Medications Ordered in ED Medications  acetaminophen (TYLENOL) 325 MG tablet (not administered)  penicillin g benzathine (BICILLIN LA) 1200000 UNIT/2ML injection 1.2 Million Units (not administered)  acetaminophen (TYLENOL) tablet 650 mg (650 mg Oral Given 12/24/15 1645)     Initial Impression / Assessment and Plan / ED Course  I have reviewed the triage vital signs and the nursing notes.  Pertinent labs & imaging results that were available during my care of the patient were reviewed by me and considered in my medical decision making (see chart for details).  Clinical Course   Patient with strep throat.  Treat with Pen IM.  Airway is clear.  Patient is stable.  Final Clinical Impressions(s) / ED Diagnoses   Final diagnoses:  Strep pharyngitis    New  Prescriptions New Prescriptions   No medications on file     Roxy Horseman, PA-C 12/24/15 2029    Pricilla Loveless, MD 12/27/15 [phone]

## 2015-12-24 NOTE — ED Triage Notes (Signed)
Pt reports to the ED for eval of HAs, sore throat, and intermittent abd pains x several days. Pt has tried OTC Ibuprofen with transient relief. Pt denies any nasal congestion or drainage. Light and sound do not affect the HA. Pt denies any N/V/D. Pt A&OX4, resp e/u, and skin warm and dry.

## 2017-06-03 ENCOUNTER — Other Ambulatory Visit (HOSPITAL_COMMUNITY)
Admission: RE | Admit: 2017-06-03 | Discharge: 2017-06-03 | Disposition: A | Discharge status: Home / Self Care | Payer: Medicaid Other | Source: Ambulatory Visit | Attending: Obstetrics & Gynecology | Admitting: Obstetrics & Gynecology

## 2017-06-03 ENCOUNTER — Ambulatory Visit (INDEPENDENT_AMBULATORY_CARE_PROVIDER_SITE_OTHER): Payer: Medicaid Other | Admitting: Obstetrics & Gynecology

## 2017-06-03 ENCOUNTER — Encounter: Payer: Self-pay | Admitting: Obstetrics & Gynecology

## 2017-06-03 VITALS — BP 124/79 | HR 66 | Wt 167.4 lb

## 2017-06-03 DIAGNOSIS — Z0000 Encounter for general adult medical examination without abnormal findings: Secondary | ICD-10-CM

## 2017-06-03 DIAGNOSIS — N898 Other specified noninflammatory disorders of vagina: Secondary | ICD-10-CM | POA: Diagnosis not present

## 2017-06-03 DIAGNOSIS — Z113 Encounter for screening for infections with a predominantly sexual mode of transmission: Secondary | ICD-10-CM

## 2017-06-03 DIAGNOSIS — Z01419 Encounter for gynecological examination (general) (routine) without abnormal findings: Secondary | ICD-10-CM

## 2017-06-03 DIAGNOSIS — N760 Acute vaginitis: Secondary | ICD-10-CM | POA: Insufficient documentation

## 2017-06-03 DIAGNOSIS — B9689 Other specified bacterial agents as the cause of diseases classified elsewhere: Secondary | ICD-10-CM | POA: Insufficient documentation

## 2017-06-03 DIAGNOSIS — Z01411 Encounter for gynecological examination (general) (routine) with abnormal findings: Secondary | ICD-10-CM

## 2017-06-03 LAB — POCT URINALYSIS DIP (DEVICE)
BILIRUBIN URINE: NEGATIVE
Glucose, UA: NEGATIVE mg/dL
Ketones, ur: NEGATIVE mg/dL
LEUKOCYTES UA: NEGATIVE
Nitrite: NEGATIVE
PH: 7 (ref 5.0–8.0)
Protein, ur: NEGATIVE mg/dL
Specific Gravity, Urine: 1.02 (ref 1.005–1.030)
Urobilinogen, UA: 0.2 mg/dL (ref 0.0–1.0)

## 2017-06-03 MED ORDER — CLINDAMYCIN PHOSPHATE 1 % EX GEL
Freq: Two times a day (BID) | CUTANEOUS | 11 refills | Status: DC
Start: 2017-06-03 — End: 2017-06-11

## 2017-06-03 NOTE — Addendum Note (Signed)
Addended by: Garret ReddishBARNES, Kendre Jacinto M on: 06/03/2017 05:07 PM   Modules accepted: Orders

## 2017-06-03 NOTE — Progress Notes (Signed)
Subjective:    Alicia [name] is a 42 y.o. female G7P6 who presents for an annual exam. The patient has complaints of vaginal itching with white discharger for about 1 month. The patient is not currently sexually active and has not been for one year.  However, she notes she has not been checked for STIs since her last sexual encounter. GYN screening history: last pap: was normal and no mammogram to date. The patient wears seatbelts: yes. The patient participates in regular exercise: no. Has the patient ever been transfused or tattooed?: no. The patient reports that there is not domestic violence in her life.    Menstrual History: OB History    Gravida Para Term Preterm AB Living   7 6 5 1 1 6    SAB TAB Ectopic Multiple Live Births   1       445      Menarche age: [age] yo  Patient's last menstrual period was 05/03/2017 (approximate).    The following portions of the patient's history were reviewed and updated as appropriate: allergies, current medications, past family history, past medical history, past social history, past surgical history and problem list.  Review of Systems Pertinent items are noted in HPI.   No family history of gynecologic or colon cancer   Objective:    BP 124/79   Pulse 66   Wt 167 lb 6.4 oz (75.9 kg)   LMP 05/03/2017 (Approximate)   Breastfeeding? No   BMI 30.62 kg/m   General Appearance:    Alert, cooperative, no distress, appears stated age  Head:    Normocephalic, without obvious abnormality, atraumatic  Eyes:    PERRL, conjunctiva/corneas clear, EOM's intact, fundi    benign, both eyes  Ears:    Normal TM's and external ear canals, both ears  Nose:   Nares normal, septum midline, mucosa normal, no drainage    or sinus tenderness  Throat:   Lips, mucosa, and tongue normal; teeth and gums normal  Neck:   Supple, symmetrical, trachea midline, no adenopathy;    thyroid:  no enlargement/tenderness/nodules; no carotid   bruit or JVD  Back:      Symmetric, no curvature, ROM normal, no CVA tenderness  Lungs:     Clear to auscultation bilaterally, respirations unlabored  Chest Wall:    No tenderness or deformity   Heart:    Regular rate and rhythm, S1 and S2 normal, no murmur, rub   or gallop  Breast Exam:    No tenderness, masses, or nipple abnormality  Abdomen:     Soft, non-tender, bowel sounds active all four quadrants,    no masses, no organomegaly  Genitalia:    Normal female without lesion, discharge or tenderness, discharge c/w BV, no masses or tenderness with bimanual     Extremities:   Extremities normal, atraumatic, no cyanosis or edema  Pulses:   2+ and symmetric all extremities  Skin:   Skin color, texture, turgor normal, no rashes or lesions  Lymph nodes:   Cervical, supraclavicular, and axillary nodes normal  Neurologic:   CNII-XII intact, normal strength, sensation and reflexes    throughout  .    Assessment:    Healthy female exam.    Plan:     Thin prep Pap smear. with cotesting STI testing per request Treat with clinda cream

## 2017-06-04 LAB — RPR: RPR Ser Ql: NONREACTIVE

## 2017-06-04 LAB — HEPATITIS C ANTIBODY

## 2017-06-04 LAB — HEPATITIS B SURFACE ANTIGEN: HEP B S AG: NEGATIVE

## 2017-06-04 LAB — HIV ANTIBODY (ROUTINE TESTING W REFLEX): HIV Screen 4th Generation wRfx: NONREACTIVE

## 2017-06-05 LAB — CYTOLOGY - PAP
Bacterial vaginitis: POSITIVE — AB
Candida vaginitis: NEGATIVE
Chlamydia: NEGATIVE
DIAGNOSIS: NEGATIVE
HPV: NOT DETECTED
Neisseria Gonorrhea: NEGATIVE
Trichomonas: NEGATIVE

## 2017-06-11 ENCOUNTER — Telehealth: Payer: Self-pay | Admitting: *Deleted

## 2017-06-11 DIAGNOSIS — N760 Acute vaginitis: Principal | ICD-10-CM

## 2017-06-11 DIAGNOSIS — B9689 Other specified bacterial agents as the cause of diseases classified elsewhere: Secondary | ICD-10-CM

## 2017-06-11 DIAGNOSIS — N898 Other specified noninflammatory disorders of vagina: Secondary | ICD-10-CM

## 2017-06-11 MED ORDER — CLINDAMYCIN PHOSPHATE 100 MG VA SUPP: 100.0000 mg | Freq: Every day | VAGINAL | 0 refills | Status: DC

## 2017-06-11 NOTE — Telephone Encounter (Signed)
Patient has mammogram scheduled 06/25/17 @ 3:30 pm. I attempted to call patient, left voice mail message stating I am calling with appointment information and to please return my call. Patient needs to wear a two piece outfit, and to avoid the use of lotions, powders, or deodorants.  Patient does need to be made aware that because of new guidelines [facility] is [facility] only [facility] starting Feb 1. Her Medicaid will be billed but she may or may not get a bill for a portion of it. If so they are wiling to work with the patient as far as payment goes. The scheduler did recommend that she call Medicaid to know for sure.

## 2017-06-11 NOTE — Telephone Encounter (Signed)
Patient called regarding a prescription that was sent in at her last visit, states the pharmacist told her it needed to be released. Rx clarified with Dr Marice Potterove and reordered. Patient is aware of mammogram appt.

## 2017-06-12 ENCOUNTER — Other Ambulatory Visit: Payer: Self-pay | Admitting: General Practice

## 2017-06-12 DIAGNOSIS — B9689 Other specified bacterial agents as the cause of diseases classified elsewhere: Secondary | ICD-10-CM

## 2017-06-12 DIAGNOSIS — N760 Acute vaginitis: Secondary | ICD-10-CM

## 2017-06-12 DIAGNOSIS — N898 Other specified noninflammatory disorders of vagina: Secondary | ICD-10-CM

## 2017-06-12 MED ORDER — CLINDAMYCIN PHOSPHATE 100 MG VA SUPP: 100.0000 mg | Freq: Every day | VAGINAL | 0 refills | Status: AC

## 2017-06-25 ENCOUNTER — Ambulatory Visit: Payer: Medicaid Other

## 2017-08-14 ENCOUNTER — Telehealth: Payer: Self-pay

## 2017-08-14 NOTE — Telephone Encounter (Signed)
Pt notified the front desk that she was in our office in January and was prescribed Flagyl that she never received.  I advised the pt that because it has now been two months we will need her to do a self swab with a nurse.  I reinterated that she will not be seeing a provider.  Pt stated that she will come in on Monday @ 1400 for the self swab.  I also explained to the pt that it may take up to 48 hrs to get the results back.  Pt state understanding with no further question.

## 2017-08-17 ENCOUNTER — Ambulatory Visit (INDEPENDENT_AMBULATORY_CARE_PROVIDER_SITE_OTHER): Payer: Medicaid Other | Admitting: General Practice

## 2017-08-17 ENCOUNTER — Other Ambulatory Visit (HOSPITAL_COMMUNITY)
Admission: RE | Admit: 2017-08-17 | Discharge: 2017-08-17 | Disposition: A | Discharge status: Home / Self Care | Payer: Medicaid Other | Source: Ambulatory Visit | Attending: Obstetrics & Gynecology | Admitting: Obstetrics & Gynecology

## 2017-08-17 DIAGNOSIS — N898 Other specified noninflammatory disorders of vagina: Secondary | ICD-10-CM | POA: Insufficient documentation

## 2017-08-17 DIAGNOSIS — N760 Acute vaginitis: Secondary | ICD-10-CM

## 2017-08-17 DIAGNOSIS — B9689 Other specified bacterial agents as the cause of diseases classified elsewhere: Secondary | ICD-10-CM

## 2017-08-17 MED ORDER — CLINDAMYCIN PHOSPHATE 100 MG VA SUPP: 100.0000 mg | Freq: Every day | VAGINAL | 0 refills | Status: AC

## 2017-08-17 MED ORDER — CLINDAMYCIN PHOSPHATE 100 MG VA SUPP: 100.0000 mg | Freq: Every day | VAGINAL | 0 refills | Status: DC

## 2017-08-17 NOTE — Progress Notes (Signed)
Patient here today for self swab. Patient states she never got her prescription from back in January. Patient denies itching or discharge but notes some odor. Patient instructed in self swab and specimen collected. Told patient we will contact her with any abnormal results. Patient verbalized understanding and states [place] and requests Rx. Rx given via Thressa ShellerHeather Hogan & patient informed. Patient had no questions

## 2017-08-17 NOTE — Progress Notes (Signed)
I have reviewed the nurse's note and agree with the plan of care.  Thressa ShellerHeather Hogan 4:22 PM 08/17/17

## 2017-08-18 LAB — CERVICOVAGINAL ANCILLARY ONLY
BACTERIAL VAGINITIS: POSITIVE — AB
Candida vaginitis: NEGATIVE
Trichomonas: NEGATIVE

## 2017-09-10 ENCOUNTER — Telehealth: Payer: Self-pay | Admitting: *Deleted

## 2017-09-10 NOTE — Telephone Encounter (Signed)
Alicia BridgeMartha called today and left a voicemail taht she needs a presciption called in- that she misplaced it.

## 2017-09-11 NOTE — Telephone Encounter (Signed)
Called patient, no answer- left message stating we are trying to reach you to return your phone call, please call us back. Tried home number, no answer & no option to leave a voicemail

## 2017-09-22 ENCOUNTER — Telehealth: Payer: Self-pay | Admitting: General Practice

## 2017-09-22 DIAGNOSIS — B9689 Other specified bacterial agents as the cause of diseases classified elsewhere: Secondary | ICD-10-CM

## 2017-09-22 DIAGNOSIS — N760 Acute vaginitis: Principal | ICD-10-CM

## 2017-09-22 MED ORDER — METRONIDAZOLE 500 MG PO TABS: 500.0000 mg | ORAL_TABLET | Freq: Two times a day (BID) | ORAL | 0 refills | Status: DC

## 2017-09-22 NOTE — Telephone Encounter (Signed)
Patient called and left message on nurse voicemail line stating she lost her prescription and isn't sure if she still needs it or not. Called patient and she states she lost her Flagyl prescription and needs another one sent in. Informed patient of med refill sent to pharmacy. Patient verbalized understanding & had no questions.

## 2017-10-29 ENCOUNTER — Other Ambulatory Visit: Payer: Self-pay

## 2017-10-29 ENCOUNTER — Ambulatory Visit (INDEPENDENT_AMBULATORY_CARE_PROVIDER_SITE_OTHER): Payer: Medicaid Other

## 2017-10-29 ENCOUNTER — Other Ambulatory Visit (HOSPITAL_COMMUNITY)
Admission: RE | Admit: 2017-10-29 | Discharge: 2017-10-29 | Disposition: A | Discharge status: Home / Self Care | Payer: Medicaid Other | Source: Ambulatory Visit | Attending: Obstetrics & Gynecology | Admitting: Obstetrics & Gynecology

## 2017-10-29 VITALS — BP 122/84 | HR 63 | Wt 173.0 lb

## 2017-10-29 DIAGNOSIS — N898 Other specified noninflammatory disorders of vagina: Secondary | ICD-10-CM

## 2017-10-29 NOTE — Progress Notes (Signed)
Patient here for yeast/bacteria vaginosis testing.  She is complaining of itching and discharge.  She did not finish her last round of antibiotic.

## 2017-10-30 ENCOUNTER — Telehealth: Payer: Self-pay

## 2017-10-30 LAB — CERVICOVAGINAL ANCILLARY ONLY
Bacterial vaginitis: POSITIVE — AB
Candida vaginitis: NEGATIVE
Chlamydia: NEGATIVE
Neisseria Gonorrhea: NEGATIVE
Trichomonas: NEGATIVE

## 2017-10-30 MED ORDER — METRONIDAZOLE 0.75 % VA GEL: 1.0000 | Freq: Every day | VAGINAL | 0 refills | Status: DC

## 2017-10-30 NOTE — Telephone Encounter (Signed)
Patient having vaginal itching issues.  Says the gel helps her best.  Metrogel sent to pharmacy.  Patient aware.

## 2017-10-30 NOTE — Telephone Encounter (Signed)
Patient called requesting results.  Unable to access mychart.

## 2018-02-22 ENCOUNTER — Encounter: Payer: Self-pay | Admitting: *Deleted

## 2018-02-22 ENCOUNTER — Ambulatory Visit: Payer: Medicaid Other | Admitting: General Practice

## 2018-02-22 ENCOUNTER — Ambulatory Visit (INDEPENDENT_AMBULATORY_CARE_PROVIDER_SITE_OTHER): Payer: Medicaid Other | Admitting: Internal Medicine

## 2018-02-22 ENCOUNTER — Other Ambulatory Visit (HOSPITAL_COMMUNITY)
Admission: RE | Admit: 2018-02-22 | Discharge: 2018-02-22 | Disposition: A | Discharge status: Home / Self Care | Payer: Medicaid Other | Source: Ambulatory Visit | Attending: Internal Medicine | Admitting: Internal Medicine

## 2018-02-22 ENCOUNTER — Encounter: Payer: Self-pay | Admitting: Internal Medicine

## 2018-02-22 VITALS — BP 138/89 | HR 69 | Ht 63.0 in | Wt 164.7 lb

## 2018-02-22 DIAGNOSIS — Z113 Encounter for screening for infections with a predominantly sexual mode of transmission: Secondary | ICD-10-CM | POA: Insufficient documentation

## 2018-02-22 DIAGNOSIS — Z3009 Encounter for other general counseling and advice on contraception: Secondary | ICD-10-CM | POA: Diagnosis not present

## 2018-02-22 DIAGNOSIS — N898 Other specified noninflammatory disorders of vagina: Secondary | ICD-10-CM | POA: Diagnosis not present

## 2018-02-22 NOTE — Patient Instructions (Signed)
Return in two weeks for your Depo shot. Please abstain from sexual intercourse for these two weeks and you will need to have a negative pregnancy test prior to receiving the shot.   We have tested you for sexually transmitted diseases as well as yeast infection. We will let you know the results and if you need any treatment.

## 2018-02-22 NOTE — Progress Notes (Signed)
Pt desires to start Depo Provera for contraception. She had unprotected sex yesterday. Pt also desires testing for STI. She is having some vaginal itching and white discharge.

## 2018-02-22 NOTE — Progress Notes (Signed)
    Subjective:    Alicia [name] - 42 y.o. female MRN [id number]  Date of birth: [birth year]  HPI  Alicia [name] is a 42 y.o. [id number] female here for vaginal discharge. Reports 1 week of itchy, white vaginal discharge. Denies foul odor, abnormal bleeding, pelvic pain, fevers and dysuria. Requests screening for STDs.   Also requests to resume Depo Provera for contraception. Last had unprotected sexual intercourse yesterday.      OB History    Gravida  7   Para  6   Term  5   Preterm  1   AB  1   Living  6     SAB  1   TAB      Ectopic      Multiple      Live Births  5             -  reports that she has been smoking cigarettes. She has been smoking about 0.50 packs per day. She has never used smokeless tobacco. - Review of Systems: Per HPI. - Past Medical History: Patient Active Problem List   Diagnosis Date Noted  . VBAC, delivered 02/09/2013  . BV (bacterial vaginosis) 09/02/2012  . Skin abscess 07/03/2012   - Medications: reviewed and updated   Objective:   Physical Exam BP 138/89   Pulse 69   Ht 5\' 3"  (1.6 m)   Wt 164 lb 11.2 oz (74.7 kg)   LMP 02/08/2018 (Exact Date)   BMI 29.18 kg/m  Gen: NAD, alert, cooperative with exam, well-appearing GU/GYN: Exam performed in the presence of a chaperone. External genitalia within normal limits.  Vaginal mucosa pink, moist, normal rugae.  Nonfriable cervix without lesions, no  bleeding noted on speculum exam.  Minimal amount of thick white discharge present in vaginal vault. Bimanual exam revealed normal, nongravid uterus.  No cervical motion tenderness. No adnexal masses bilaterally.       Assessment & Plan:   1. Vaginal discharge Appears most concerning for yeast infection but will await culture results.  - Cervicovaginal ancillary only  2. Screening for STD (sexually transmitted disease) - HIV Antibody (routine testing w rflx) - RPR - Cervicovaginal ancillary only  3. Encounter for other  general counseling or advice on contraception Discussed with patient that she will need to abstain from sexual intercourse for 2 weeks and return for Depo per protocol with negative Upreg test prior to administration.    Routine preventative health maintenance measures emphasized. Please refer to After Visit Summary for other counseling recommendations.   Return if symptoms worsen or fail to improve.  Marcy Siren, D.O. OB Fellow  02/22/2018, 5:02 PM

## 2018-02-23 LAB — HIV ANTIBODY (ROUTINE TESTING W REFLEX): HIV SCREEN 4TH GENERATION: NONREACTIVE

## 2018-02-23 LAB — RPR: RPR Ser Ql: NONREACTIVE

## 2018-02-24 ENCOUNTER — Other Ambulatory Visit: Payer: Self-pay | Admitting: Internal Medicine

## 2018-02-24 ENCOUNTER — Telehealth: Payer: Self-pay

## 2018-02-24 DIAGNOSIS — N760 Acute vaginitis: Principal | ICD-10-CM

## 2018-02-24 DIAGNOSIS — B9689 Other specified bacterial agents as the cause of diseases classified elsewhere: Secondary | ICD-10-CM

## 2018-02-24 LAB — CERVICOVAGINAL ANCILLARY ONLY
Bacterial vaginitis: POSITIVE — AB
Candida vaginitis: POSITIVE — AB
Chlamydia: NEGATIVE
Neisseria Gonorrhea: NEGATIVE
Trichomonas: NEGATIVE

## 2018-02-24 MED ORDER — FLUCONAZOLE 150 MG PO TABS: ORAL_TABLET | ORAL | 0 refills | Status: DC

## 2018-02-24 MED ORDER — METRONIDAZOLE 500 MG PO TABS: 500.0000 mg | ORAL_TABLET | Freq: Two times a day (BID) | ORAL | 0 refills | Status: DC

## 2018-02-24 NOTE — Telephone Encounter (Signed)
Pt called requesting test results. 

## 2018-02-24 NOTE — Progress Notes (Signed)
Your test results were positive for bacterial vaginosis, an overgrowth of normal bacteria in the vagina. I have sent 7 days of Flagyl to your pharmacy. Take this twice per day. Do not drink alcohol while taking this medication.   Your test results were also positive for a yeast infection, I have sent Diflucan to your pharmacy to treat this.   Take Care,   Dr. Earlene Plater

## 2018-02-25 MED ORDER — METRONIDAZOLE 0.75 % VA GEL: 1.0000 | Freq: Every day | VAGINAL | 0 refills | Status: AC

## 2018-02-25 NOTE — Telephone Encounter (Signed)
Returned pt's call about her results. Pt reports she uses the gel for BV and does not take the pill.  Pt ok to take the Diflucan because it is one dose.  Called in Metrogel per protocol and gave instructions to pt.  Pt verbalized understanding.

## 2018-02-28 ENCOUNTER — Encounter (HOSPITAL_COMMUNITY): Payer: Self-pay

## 2018-02-28 ENCOUNTER — Inpatient Hospital Stay (HOSPITAL_COMMUNITY)
Admission: AD | Admit: 2018-02-28 | Discharge: 2018-02-28 | Disposition: A | Discharge status: Home / Self Care | Payer: Medicaid Other | Source: Ambulatory Visit | Attending: Obstetrics and Gynecology | Admitting: Obstetrics and Gynecology

## 2018-02-28 ENCOUNTER — Other Ambulatory Visit: Payer: Self-pay

## 2018-02-28 DIAGNOSIS — Z3202 Encounter for pregnancy test, result negative: Secondary | ICD-10-CM | POA: Insufficient documentation

## 2018-02-28 DIAGNOSIS — N939 Abnormal uterine and vaginal bleeding, unspecified: Secondary | ICD-10-CM

## 2018-02-28 DIAGNOSIS — F17210 Nicotine dependence, cigarettes, uncomplicated: Secondary | ICD-10-CM | POA: Insufficient documentation

## 2018-02-28 LAB — URINALYSIS, ROUTINE W REFLEX MICROSCOPIC
BACTERIA UA: NONE SEEN
Bilirubin Urine: NEGATIVE
Glucose, UA: NEGATIVE mg/dL
KETONES UR: NEGATIVE mg/dL
Nitrite: NEGATIVE
PH: 6 (ref 5.0–8.0)
Protein, ur: 100 mg/dL — AB
RBC / HPF: >50 RBC/hpf — ABNORMAL HIGH (ref 0–5)
Specific Gravity, Urine: 1.021 (ref 1.005–1.030)

## 2018-02-28 LAB — POCT PREGNANCY, URINE: PREG TEST UR: NEGATIVE

## 2018-02-28 MED ORDER — KETOROLAC TROMETHAMINE 60 MG/2ML IM SOLN
60.0000 mg | INTRAMUSCULAR | Status: AC
  Administered 2018-02-28: 60 mg via INTRAMUSCULAR
  Filled 2018-02-28: qty 2

## 2018-02-28 NOTE — Discharge Instructions (Signed)
In late 2019, the [facility] will be moving to the [facility] campus. At that time, the MAU (Maternity Admissions Unit), where you are being seen today, will no longer take care of non-pregnant patients. We strongly encourage you to find a doctor's office before that time, so that you can be seen with any GYN concerns, like vaginal discharge, urinary tract infection, etc.. in a timely manner.  In order to make an office visit more convenient, the Center for [facility] at [facility] will be offering evening hours with same-day appointments, walk-in appointments and scheduled appointments available during this time.  Center for [facility] Hours: Monday - 8am - 7:30 pm with walk-in between 4pm- 7:30 pm Tuesday - 8 am - 5 pm open late and accepting walk-ins from 4pm - 7:30pm) Wednesday - 8 am - 5 pm open late and accepting walk-ins from 4pm - 7:30pm) Thursday 8 am - 5 pm (starting 02/18/18 we will be open late and accepting walk-ins from 4pm - 7:30pm) Friday 8 am - 5 pm  For an appointment please call the Center for [facility] @ [facility] at [phone]  For urgent needs, [facility] is also available for management of urgent GYN complaints such as vaginal discharge or urinary tract infections.

## 2018-02-28 NOTE — MAU Provider Note (Signed)
History     CSN: [id number]  Arrival date and time: 02/28/18 1410   First Provider Initiated Contact with Patient 02/28/18 1531      Chief Complaint  Patient presents with  . Abdominal Pain  . Vaginal Bleeding   HPI  Ms.  Alicia [name] is a 42 y.o. year old [id number] non-pregnant female who presents to MAU reporting heavy VB like a period, abdominal pain since 02/24/18. She reports she had some vaginal itching and was advised by her mother to apply hydrocortisone cream; which caused labial burning. She started bleeding the same day she applied the hydrocortisone cream and wonders if this was the cause of her VB. She is currently using Metrogel for BV (dx'd 02/22/18), but stopped using it Friday 02/26/18.  Past Medical History:  Diagnosis Date  . Anemia   . No pertinent past medical history     Past Surgical History:  Procedure Laterality Date  . CESAREAN SECTION    . FOOT SURGERY    . INCISION AND DRAINAGE INTRA ORAL ABSCESS Right 07/06/2012    Family History  Problem Relation Age of Onset  . Diabetes Paternal Grandmother   . Congenital heart disease Son   . Hypertension Mother   . Other Neg Hx     Social History   Tobacco Use  . Smoking status: Current Every Day Smoker    Packs/day: 0.50    Types: Cigarettes  . Smokeless tobacco: Never Used  Substance Use Topics  . Alcohol use: Yes    Comment: 2 drinks per week  . Drug use: No    Allergies: No Known Allergies  Medications Prior to Admission  Medication Sig Dispense Refill Last Dose  . metroNIDAZOLE (FLAGYL) 500 MG tablet Take 1 tablet (500 mg total) by mouth 2 (two) times daily for 7 days. 14 tablet 0 Past Week at Unknown time  . metroNIDAZOLE (METROGEL) 0.75 % vaginal gel Place 1 Applicatorful vaginally at bedtime for 5 days. 50 g 0 Past Week at Unknown time  . fluconazole (DIFLUCAN) 150 MG tablet Take 1 tablet today. Take 2nd tablet in 72 hours. 2 tablet 0 Unknown at Unknown time    Review of Systems   Constitutional: Negative.   HENT: Negative.   Eyes: Negative.   Respiratory: Negative.   Cardiovascular: Negative.   Gastrointestinal: Positive for abdominal pain (lower cramping).  Endocrine: Negative.   Genitourinary: Positive for pelvic pain and vaginal bleeding.  Musculoskeletal: Negative.   Skin: Negative.   Allergic/Immunologic: Negative.   Neurological: Negative.   Hematological: Negative.   Psychiatric/Behavioral: Negative.    Physical Exam   Blood pressure (!) 143/91, pulse 68, temperature 98 F (36.7 C), temperature source Oral, resp. rate 18, weight 75.9 kg, last menstrual period 02/08/2018.  Physical Exam  Nursing note and vitals reviewed. Constitutional: She is oriented to person, place, and time. She appears well-developed and well-nourished.  HENT:  Head: Normocephalic and atraumatic.  Eyes: Pupils are equal, round, and reactive to light.  Neck: Normal range of motion.  Cardiovascular: Normal rate.  Respiratory: Effort normal.  GI: Soft.  Genitourinary:  Genitourinary Comments: Uterus: non-tender, SE: cervix is smooth, pink, no lesions, moderate amt of dark, red blood with small clots in vaginal vault, closed/long/firm, no CMT or friability, no adnexal tenderness   Musculoskeletal: Normal range of motion.  Neurological: She is alert and oriented to person, place, and time.  Skin: Skin is warm and dry.  Psychiatric: She has a normal mood and affect. Her  behavior is normal. Judgment and thought content normal.    MAU Course  Procedures  MDM CCUA UPT Pelvic Exam Toradol 60 mg IM -- improved pain  Results for orders placed or performed during the hospital encounter of 02/28/18 (from the past 24 hour(s))  Pregnancy, urine POC     Status: None   Collection Time: 02/28/18  3:27 PM  Result Value Ref Range   Preg Test, Ur NEGATIVE NEGATIVE    Assessment and Plan  Vagina bleeding - Plan: Discharge patient - Reassurance given that VB is probably normal  menses - Continue Metrogel tx - F/U with [facility] prn - Discharge home - Patient verbalized an understanding of the plan of care and agrees.   Raelyn Mora, MSN, CNM 02/28/2018, 3:31 PM

## 2018-02-28 NOTE — MAU Note (Signed)
Used hydrocortisone for vaginal itching on Wednesday and saw some bleeding  States now bleeding is heavier and having pain. States its like she is on her period  Rx for metrogel but hasn't taken any Friday bc of the bleeding

## 2018-04-08 ENCOUNTER — Encounter: Payer: Self-pay | Admitting: Nurse Practitioner

## 2018-04-08 ENCOUNTER — Ambulatory Visit (INDEPENDENT_AMBULATORY_CARE_PROVIDER_SITE_OTHER): Payer: Medicaid Other | Admitting: Nurse Practitioner

## 2018-04-08 DIAGNOSIS — Z3202 Encounter for pregnancy test, result negative: Secondary | ICD-10-CM | POA: Diagnosis not present

## 2018-04-08 DIAGNOSIS — Z30013 Encounter for initial prescription of injectable contraceptive: Secondary | ICD-10-CM

## 2018-04-08 DIAGNOSIS — Z1239 Encounter for other screening for malignant neoplasm of breast: Secondary | ICD-10-CM

## 2018-04-08 LAB — POCT PREGNANCY, URINE: Preg Test, Ur: NEGATIVE

## 2018-04-08 MED ORDER — MEDROXYPROGESTERONE ACETATE 150 MG/ML IM SUSP
150.0000 mg | Freq: Once | INTRAMUSCULAR | Status: AC
  Administered 2018-04-08: 150 mg via INTRAMUSCULAR

## 2018-04-08 NOTE — Progress Notes (Signed)
mmHere for depo provera . States has used condoms with each intercourse,last 5 days ago. Would like to get mammogram. Declines flu shot. UPt negative, may give depo-provera per order.  Reviewed chart.  Had annual exam in January 2019.  Had Reviewed plan of care.  Client to start Depo today with a negative pregnancy test..  Alicia BernheimERRI BURLESON, RN, MSN, NP-BC Nurse Practitioner, [facility], [facility] 04/08/2018 4:58 PM

## 2018-05-25 ENCOUNTER — Ambulatory Visit: Payer: Medicaid Other

## 2018-06-22 ENCOUNTER — Ambulatory Visit
Admission: RE | Admit: 2018-06-22 | Discharge: 2018-06-22 | Disposition: A | Discharge status: Home / Self Care | Payer: Medicaid Other | Source: Ambulatory Visit | Attending: Nurse Practitioner | Admitting: Nurse Practitioner

## 2018-06-22 DIAGNOSIS — Z1239 Encounter for other screening for malignant neoplasm of breast: Secondary | ICD-10-CM

## 2018-06-22 DIAGNOSIS — Z1231 Encounter for screening mammogram for malignant neoplasm of breast: Secondary | ICD-10-CM | POA: Diagnosis not present

## 2018-06-24 ENCOUNTER — Ambulatory Visit (INDEPENDENT_AMBULATORY_CARE_PROVIDER_SITE_OTHER): Payer: Medicaid Other

## 2018-06-24 VITALS — Wt 168.0 lb

## 2018-06-24 DIAGNOSIS — Z3042 Encounter for surveillance of injectable contraceptive: Secondary | ICD-10-CM | POA: Diagnosis not present

## 2018-06-24 MED ORDER — MEDROXYPROGESTERONE ACETATE 150 MG/ML IM SUSP
150.0000 mg | Freq: Once | INTRAMUSCULAR | Status: AC
  Administered 2018-06-24: 150 mg via INTRAMUSCULAR

## 2018-06-24 NOTE — Progress Notes (Signed)
e

## 2018-09-10 ENCOUNTER — Ambulatory Visit (INDEPENDENT_AMBULATORY_CARE_PROVIDER_SITE_OTHER): Payer: Medicaid Other | Admitting: *Deleted

## 2018-09-10 ENCOUNTER — Other Ambulatory Visit: Payer: Self-pay

## 2018-09-10 VITALS — BP 140/95 | HR 82 | Ht 63.0 in | Wt 161.4 lb

## 2018-09-10 DIAGNOSIS — Z3042 Encounter for surveillance of injectable contraceptive: Secondary | ICD-10-CM

## 2018-09-10 MED ORDER — MEDROXYPROGESTERONE ACETATE 150 MG/ML IM SUSP
150.0000 mg | Freq: Once | INTRAMUSCULAR | Status: AC
  Administered 2018-09-10: 10:00:00 150 mg via INTRAMUSCULAR

## 2018-09-10 NOTE — Progress Notes (Signed)
Alicia [name] here for Depo-Provera  Injection.  Injection administered without complication. Patient will return in 3 months for next injection.  Osvaldo Human, RN 09/10/2018  10:08 AM

## 2018-09-16 NOTE — Progress Notes (Signed)
I have reviewed this chart and agree with the RN/CMA assessment and management.    Roxine Whittinghill C Ilir Mahrt, MD, FACOG Attending Physician, [facility]   

## 2018-11-29 ENCOUNTER — Ambulatory Visit (INDEPENDENT_AMBULATORY_CARE_PROVIDER_SITE_OTHER): Payer: Medicaid Other | Admitting: General Practice

## 2018-11-29 ENCOUNTER — Other Ambulatory Visit: Payer: Self-pay

## 2018-11-29 VITALS — BP 131/87 | HR 79 | Ht 63.0 in | Wt 154.0 lb

## 2018-11-29 DIAGNOSIS — Z3042 Encounter for surveillance of injectable contraceptive: Secondary | ICD-10-CM

## 2018-11-29 MED ORDER — MEDROXYPROGESTERONE ACETATE 150 MG/ML IM SUSP
150.0000 mg | Freq: Once | INTRAMUSCULAR | Status: AC
  Administered 2018-11-29: 150 mg via INTRAMUSCULAR

## 2018-11-29 NOTE — Progress Notes (Signed)
Patient seen and assessed by nursing staff.  Agree with documentation and plan.  

## 2018-11-29 NOTE — Progress Notes (Signed)
Georgiana Spinner here for Depo-Provera  Injection.  Injection administered without complication. Patient will return in 3 months for next injection.  Derinda Late, RN 11/29/2018  3:23 PM

## 2019-02-15 ENCOUNTER — Other Ambulatory Visit: Payer: Self-pay

## 2019-02-15 ENCOUNTER — Ambulatory Visit (INDEPENDENT_AMBULATORY_CARE_PROVIDER_SITE_OTHER): Payer: Medicaid Other | Admitting: Emergency Medicine

## 2019-02-15 DIAGNOSIS — Z3042 Encounter for surveillance of injectable contraceptive: Secondary | ICD-10-CM | POA: Diagnosis not present

## 2019-02-15 MED ORDER — MEDROXYPROGESTERONE ACETATE 150 MG/ML IM SUSP
150.0000 mg | Freq: Once | INTRAMUSCULAR | Status: AC
  Administered 2019-02-15: 150 mg via INTRAMUSCULAR

## 2019-02-15 NOTE — Progress Notes (Signed)
Alicia [name] here for Depo-Provera  Injection.  Injection administered without complication. Patient will return in 3 months for next injection.  Loma Sousa, RN 02/15/19

## 2019-04-22 ENCOUNTER — Emergency Department (HOSPITAL_COMMUNITY)
Admission: EM | Admit: 2019-04-22 | Discharge: 2019-04-23 | Disposition: A | Discharge status: Home / Self Care | Payer: Medicaid Other | Attending: Pediatric Emergency Medicine | Admitting: Pediatric Emergency Medicine

## 2019-04-22 DIAGNOSIS — Z1159 Encounter for screening for other viral diseases: Secondary | ICD-10-CM | POA: Diagnosis present

## 2019-04-22 DIAGNOSIS — Z20828 Contact with and (suspected) exposure to other viral communicable diseases: Secondary | ICD-10-CM | POA: Insufficient documentation

## 2019-04-22 DIAGNOSIS — F17210 Nicotine dependence, cigarettes, uncomplicated: Secondary | ICD-10-CM | POA: Insufficient documentation

## 2019-04-22 DIAGNOSIS — Z20822 Contact with and (suspected) exposure to covid-19: Secondary | ICD-10-CM

## 2019-04-22 DIAGNOSIS — Z03818 Encounter for observation for suspected exposure to other biological agents ruled out: Secondary | ICD-10-CM | POA: Diagnosis not present

## 2019-04-23 LAB — SARS CORONAVIRUS 2 (TAT 6-24 HRS): SARS Coronavirus 2: NEGATIVE

## 2019-04-24 NOTE — ED Provider Notes (Signed)
[place] EMERGENCY DEPARTMENT Provider Note   CSN: [id number] Arrival date & time: 04/22/19  2336     History   Chief Complaint No chief complaint on file.   HPI Alicia [name] is a 43 y.o. female.     HPI  43yo F here for COVId testing with family following close covid exposure.  No symptoms.  Eating and drinking well.  No fevers. No daily medications.  Patient endorses several alcoholic drinks prior to visit 2/2 nerves from possible exposure to family.    Past Medical History:  Diagnosis Date  . Anemia   . No pertinent past medical history     Patient Active Problem List   Diagnosis Date Noted  . VBAC, delivered 02/09/2013  . BV (bacterial vaginosis) 09/02/2012  . Skin abscess 07/03/2012    Past Surgical History:  Procedure Laterality Date  . CESAREAN SECTION    . FOOT SURGERY    . INCISION AND DRAINAGE INTRA ORAL ABSCESS Right 07/06/2012     OB History    Gravida  7   Para  6   Term  5   Preterm  1   AB  1   Living  6     SAB  1   TAB      Ectopic      Multiple      Live Births  5            Home Medications    Prior to Admission medications   Medication Sig Start Date End Date Taking? Authorizing Provider  fluconazole (DIFLUCAN) 150 MG tablet Take 1 tablet today. Take 2nd tablet in 72 hours. Patient not taking: Reported on 06/24/2018 02/24/18   Nicolette Bang, DO    Family History Family History  Problem Relation Age of Onset  . Diabetes Paternal Grandmother   . Congenital heart disease Son   . Hypertension Mother   . Other Neg Hx   . Breast cancer Neg Hx     Social History Social History   Tobacco Use  . Smoking status: Current Every Day Smoker    Packs/day: 0.50    Types: Cigarettes  . Smokeless tobacco: Never Used  Substance Use Topics  . Alcohol use: Yes    Comment: 2 drinks per week  . Drug use: No     Allergies   Patient has no known allergies.   Review of Systems  Review of Systems  Constitutional: Negative for activity change, chills and fever.  HENT: Negative for ear pain and sore throat.   Eyes: Negative for pain and visual disturbance.  Respiratory: Negative for cough and shortness of breath.   Cardiovascular: Negative for chest pain and palpitations.  Gastrointestinal: Negative for abdominal pain and vomiting.  Genitourinary: Negative for dysuria and hematuria.  Musculoskeletal: Negative for arthralgias and back pain.  Skin: Negative for color change and rash.  Neurological: Negative for seizures and syncope.  All other systems reviewed and are negative.    Physical Exam Updated Vital Signs BP (!) 167/110   Pulse 96   Temp 98.3 F (36.8 C) (Oral)   Resp 16   SpO2 97%   Physical Exam Vitals signs and nursing note reviewed.  Constitutional:      General: She is not in acute distress.    Appearance: She is well-developed.     Comments: Intoxicated with slurred speech on history/exam  HENT:     Head: Normocephalic and atraumatic.  Nose: No congestion or rhinorrhea.  Eyes:     Extraocular Movements: Extraocular movements intact.     Conjunctiva/sclera: Conjunctivae normal.     Pupils: Pupils are equal, round, and reactive to light.  Neck:     Musculoskeletal: Neck supple.  Cardiovascular:     Rate and Rhythm: Normal rate and regular rhythm.     Heart sounds: No murmur.  Pulmonary:     Effort: Pulmonary effort is normal. No respiratory distress.     Breath sounds: Normal breath sounds.  Abdominal:     Palpations: Abdomen is soft.     Tenderness: There is no abdominal tenderness.  Skin:    General: Skin is warm and dry.  Neurological:     Mental Status: She is alert and oriented to person, place, and time.     Cranial Nerves: No cranial nerve deficit.     Sensory: No sensory deficit.     Motor: No weakness.     Gait: Gait normal.      ED Treatments / Results  Labs (all labs ordered are listed, but only abnormal  results are displayed) Labs Reviewed  SARS CORONAVIRUS 2 (TAT 6-24 HRS)    EKG None  Radiology No results found.  Procedures Procedures (including critical care time)  Medications Ordered in ED Medications - No data to display   Initial Impression / Assessment and Plan / ED Course  I have reviewed the triage vital signs and the nursing notes.  Pertinent labs & imaging results that were available during my care of the patient were reviewed by me and considered in my medical decision making (see chart for details).        Alicia [name] was evaluated in Emergency Department on 04/24/2019 for the symptoms described in the history of present illness. She was evaluated in the context of the global COVID-19 pandemic, which necessitated consideration that the patient might be at risk for infection with the SARS-CoV-2 virus that causes COVID-19. Institutional protocols and algorithms that pertain to the evaluation of patients at risk for COVID-19 are in a state of rapid change based on information released by regulatory bodies including the CDC and federal and state organizations. These policies and algorithms were followed during the patient's care in the ED.  Patient is a 43 year old female mother patient seen in the emergency department.  Patient is here for Covid testing.  On exam patient with slurred speech and endorses several alcoholic drinks prior to arrival today.  No other focal neurologic deficit.  No other signs of illness at this time.  Will send off Covid testing which is pending at time of discharge.  Patient did drive her family here and we will arrange for safe home-going for children and patient with other family member.  Doubt serious bacterial infection or other concerns at this time.  Return precautions discussed with patient patient discharged.  Final Clinical Impressions(s) / ED Diagnoses   Final diagnoses:  Close exposure to COVID-19 virus    ED Discharge  Orders    None       Daniel Ritthaler, Wyvonnia Dusky, MD 04/24/19 [phone]

## 2019-05-06 ENCOUNTER — Ambulatory Visit: Payer: Medicaid Other

## 2019-05-24 ENCOUNTER — Ambulatory Visit: Payer: Medicaid Other

## 2019-09-15 ENCOUNTER — Other Ambulatory Visit: Payer: Self-pay | Admitting: Nurse Practitioner

## 2019-09-15 ENCOUNTER — Other Ambulatory Visit: Payer: Self-pay | Admitting: Student

## 2019-09-15 DIAGNOSIS — Z1231 Encounter for screening mammogram for malignant neoplasm of breast: Secondary | ICD-10-CM

## 2019-09-19 ENCOUNTER — Ambulatory Visit: Payer: Medicaid Other | Admitting: Student

## 2019-09-26 ENCOUNTER — Ambulatory Visit: Payer: Medicaid Other

## 2019-10-26 ENCOUNTER — Encounter: Payer: Self-pay | Admitting: Certified Nurse Midwife

## 2019-10-26 ENCOUNTER — Other Ambulatory Visit: Payer: Self-pay

## 2019-10-26 ENCOUNTER — Other Ambulatory Visit (HOSPITAL_COMMUNITY)
Admission: RE | Admit: 2019-10-26 | Discharge: 2019-10-26 | Disposition: A | Discharge status: Home / Self Care | Payer: Medicaid Other | Source: Ambulatory Visit | Attending: Certified Nurse Midwife | Admitting: Certified Nurse Midwife

## 2019-10-26 ENCOUNTER — Ambulatory Visit (INDEPENDENT_AMBULATORY_CARE_PROVIDER_SITE_OTHER): Payer: Medicaid Other | Admitting: Certified Nurse Midwife

## 2019-10-26 VITALS — BP 133/92 | HR 73 | Ht 63.0 in | Wt 122.7 lb

## 2019-10-26 DIAGNOSIS — Z8742 Personal history of other diseases of the female genital tract: Secondary | ICD-10-CM | POA: Diagnosis not present

## 2019-10-26 DIAGNOSIS — N898 Other specified noninflammatory disorders of vagina: Secondary | ICD-10-CM | POA: Insufficient documentation

## 2019-10-26 DIAGNOSIS — Z01419 Encounter for gynecological examination (general) (routine) without abnormal findings: Secondary | ICD-10-CM | POA: Insufficient documentation

## 2019-10-26 DIAGNOSIS — Z124 Encounter for screening for malignant neoplasm of cervix: Secondary | ICD-10-CM

## 2019-10-26 DIAGNOSIS — Z3042 Encounter for surveillance of injectable contraceptive: Secondary | ICD-10-CM

## 2019-10-26 DIAGNOSIS — Z3202 Encounter for pregnancy test, result negative: Secondary | ICD-10-CM | POA: Diagnosis not present

## 2019-10-26 LAB — POCT PREGNANCY, URINE: Preg Test, Ur: NEGATIVE

## 2019-10-26 MED ORDER — MEDROXYPROGESTERONE ACETATE 150 MG/ML IM SUSP
150.0000 mg | Freq: Once | INTRAMUSCULAR | Status: AC
  Administered 2019-10-26: 150 mg via INTRAMUSCULAR

## 2019-10-26 MED ORDER — METRONIDAZOLE 0.75 % VA GEL: 1.0000 | Freq: Every day | VAGINAL | 1 refills | Status: AC

## 2019-10-26 NOTE — Progress Notes (Signed)
GYNECOLOGY ANNUAL PREVENTATIVE CARE ENCOUNTER NOTE  History:     Alicia [name] is a 44 y.o. [phone] female here for a routine annual gynecologic exam.  Current complaints: vaginal discharge. Denies abnormal vaginal bleeding, pelvic pain, problems with intercourse or other gynecologic concerns. Pt request pap smear today.    Gynecologic History No LMP recorded. Patient has had an injection. Contraception: Depo-Provera injections Last Pap: 05/2017. Results were: normal with negative HPV Last mammogram: 06/2018. Results were: normal. Encouraged patient to schedule yearly mammogram for 2021, patient verbalizes understanding   Obstetric History OB History  Gravida Para Term Preterm AB Living  7 6 5 1 1 6   SAB TAB Ectopic Multiple Live Births  1       5    # Outcome Date GA Lbr Len/2nd Weight Sex Delivery Anes PTL Lv  7 Term 12/28/12 [redacted]w[redacted]d / 00:18 7 lb 1 oz (3.204 kg) M VBAC EPI  LIV  6 Preterm 02/02/06 [redacted]w[redacted]d  3 lb 8 oz (1.588 kg) M CS-LTranv EPI N      Birth Comments: fetal distress, SGA. Dr [redacted]w[redacted]d  5 Term 05/15/05 [redacted]w[redacted]d  6 lb 7 oz (2.92 kg) F Vag-Spont EPI N LIV  4 Term 03/21/02 [redacted]w[redacted]d  6 lb (2.722 kg) F Vag-Spont EPI N LIV  3 SAB 2002 [redacted]w[redacted]d         2 Term 04/17/99 [redacted]w[redacted]d  6 lb 10 oz (3.005 kg) F Vag-Spont  N LIV  1 Term 12/26/97 [redacted]w[redacted]d  6 lb 8 oz (2.948 kg) F Vag-Spont   LIV    Past Medical History:  Diagnosis Date  . Anemia   . No pertinent past medical history     Past Surgical History:  Procedure Laterality Date  . CESAREAN SECTION    . FOOT SURGERY    . INCISION AND DRAINAGE INTRA ORAL ABSCESS Right 07/06/2012    Current Outpatient Medications on File Prior to Visit  Medication Sig Dispense Refill  . fluconazole (DIFLUCAN) 150 MG tablet Take 1 tablet today. Take 2nd tablet in 72 hours. (Patient not taking: Reported on 06/24/2018) 2 tablet 0   No current facility-administered medications on file prior to visit.    No Known Allergies  Social History:  reports that  she has been smoking cigarettes. She has been smoking about 0.50 packs per day. She has never used smokeless tobacco. She reports current alcohol use. She reports that she does not use drugs.  Family History  Problem Relation Age of Onset  . Diabetes Paternal Grandmother   . Congenital heart disease Son   . Hypertension Mother   . Other Neg Hx   . Breast cancer Neg Hx     The following portions of the patient's history were reviewed and updated as appropriate: allergies, current medications, past family history, past medical history, past social history, past surgical history and problem list.  Review of Systems Pertinent items noted in HPI and remainder of comprehensive ROS otherwise negative.  Physical Exam:  BP (!) 133/92   Pulse 73   Ht 5\' 3"  (1.6 m)   Wt 122 lb 11.2 oz (55.7 kg)   BMI 21.74 kg/m  CONSTITUTIONAL: Well-developed, well-nourished female in no acute distress.  HENT:  Normocephalic, atraumatic, External right and left ear normal. Oropharynx is clear and moist EYES: Conjunctivae and EOM are normal. Pupils are equal, round, and reactive to light. NECK: Normal range of motion, supple, no masses.  Normal thyroid.  SKIN: Skin is warm and  dry. No rash noted. Not diaphoretic. No erythema. No pallor. MUSCULOSKELETAL: Normal range of motion. No tenderness.  No cyanosis, clubbing, or edema.  2+ distal pulses. NEUROLOGIC: Alert and oriented to person, place, and time. Normal reflexes, muscle tone coordination.  PSYCHIATRIC: Normal mood and affect. Normal behavior. Normal judgment and thought content. CARDIOVASCULAR: Normal heart rate noted, regular rhythm RESPIRATORY: Clear to auscultation bilaterally. Effort and breath sounds normal, no problems with respiration noted. BREASTS: Symmetric in size. No masses, tenderness, skin changes, nipple drainage, or lymphadenopathy bilaterally. Performed in the presence of a chaperone. ABDOMEN: Soft, no distention noted.  No tenderness,  rebound or guarding.  PELVIC: Normal appearing external genitalia and urethral meatus; normal appearing vaginal mucosa and cervix. Moderate amount of white thin discharge noted.  Pap smear obtained.  Normal uterine size, no other palpable masses, no uterine or adnexal tenderness.  Performed in the presence of a chaperone.   Assessment and Plan:    1. Encounter for Depo-Provera contraception - Pt request restart of depo, patient does not want any other form of birth control  - Discussed with patient risk of osteoporosis in older patient's with continued use of depo, patient verbalizes understanding  - UPT negative in office today  - medroxyPROGESTERone (DEPO-PROVERA) injection 150 mg - Pregnancy, urine POC  2. Women's annual routine gynecological examination - well woman examination complete  - Cytology - PAP( [place])  3. Vaginal discharge - Patient reports white thin discharge over the past 2-3 weeks, hx of bacterial vaginosis - Patient request vaginal gel for tx instead of oral medication  - Cervicovaginal ancillary only( [place]) - metroNIDAZOLE (METROGEL) 0.75 % vaginal gel; Place 1 Applicatorful vaginally at bedtime. Apply one applicatorful to vagina at bedtime for 5 days  Dispense: 70 g; Refill: 1  Will follow up results of pap smear and manage accordingly. Mammogram ordered and needs to be scheduled  Routine preventative health maintenance measures emphasized. Please refer to After Visit Summary for other counseling recommendations.      Lajean Manes, [place] for [employer], [place]

## 2019-10-27 LAB — CERVICOVAGINAL ANCILLARY ONLY
Bacterial Vaginitis (gardnerella): POSITIVE — AB
Candida Glabrata: NEGATIVE
Candida Vaginitis: NEGATIVE
Comment: NEGATIVE
Comment: NEGATIVE
Comment: NEGATIVE

## 2019-10-27 LAB — CYTOLOGY - PAP
Comment: NEGATIVE
Diagnosis: NEGATIVE
High risk HPV: NEGATIVE

## 2019-11-23 ENCOUNTER — Other Ambulatory Visit: Payer: Self-pay

## 2019-11-23 ENCOUNTER — Encounter (HOSPITAL_COMMUNITY): Payer: Self-pay | Admitting: Emergency Medicine

## 2019-11-23 ENCOUNTER — Ambulatory Visit (HOSPITAL_COMMUNITY)
Admission: EM | Admit: 2019-11-23 | Discharge: 2019-11-23 | Disposition: A | Discharge status: Home / Self Care | Payer: Medicaid Other | Attending: Urgent Care | Admitting: Urgent Care

## 2019-11-23 DIAGNOSIS — H5711 Ocular pain, right eye: Secondary | ICD-10-CM

## 2019-11-23 DIAGNOSIS — H00012 Hordeolum externum right lower eyelid: Secondary | ICD-10-CM

## 2019-11-23 DIAGNOSIS — H1031 Unspecified acute conjunctivitis, right eye: Secondary | ICD-10-CM

## 2019-11-23 MED ORDER — ERYTHROMYCIN 5 MG/GM OP OINT: TOPICAL_OINTMENT | OPHTHALMIC | 0 refills | Status: DC

## 2019-11-23 NOTE — ED Provider Notes (Signed)
[facility]   MRN: [id number] DOB: [birth year]  Subjective:   Alicia [name] is a 44 y.o. female presenting for several day history of of right eye watering, mild pain and irritation.  Patient had been working on a stye of the right lower eyelid, states that she kept picking at it and finally got it to pop.  Then her symptoms develop.  Denies fever, photophobia, eye trauma.  Does not wear glasses or contacts.  No current facility-administered medications for this encounter.  Current Outpatient Medications:  .  fluconazole (DIFLUCAN) 150 MG tablet, Take 1 tablet today. Take 2nd tablet in 72 hours. (Patient not taking: Reported on 06/24/2018), Disp: 2 tablet, Rfl: 0 .  metroNIDAZOLE (METROGEL) 0.75 % vaginal gel, Place 1 Applicatorful vaginally at bedtime. Apply one applicatorful to vagina at bedtime for 5 days (Patient not taking: Reported on 11/23/2019), Disp: 70 g, Rfl: 1   No Known Allergies  Past Medical History:  Diagnosis Date  . Anemia   . No pertinent past medical history      Past Surgical History:  Procedure Laterality Date  . CESAREAN SECTION    . FOOT SURGERY    . INCISION AND DRAINAGE INTRA ORAL ABSCESS Right 07/06/2012    Family History  Problem Relation Age of Onset  . Diabetes Paternal Grandmother   . Congenital heart disease Son   . Hypertension Mother   . Other Neg Hx   . Breast cancer Neg Hx     Social History   Tobacco Use  . Smoking status: Current Every Day Smoker    Packs/day: 0.50    Types: Cigarettes  . Smokeless tobacco: Never Used  Vaping Use  . Vaping Use: Never used  Substance Use Topics  . Alcohol use: Yes    Comment: 2 drinks per week  . Drug use: No    ROS   Objective:   Vitals: BP (!) 141/95 (BP Location: Right Arm)   Pulse 77   Temp 98.1 F (36.7 C) (Oral)   Resp 18   SpO2 100%     Visual Acuity  Right Eye Distance:  20/50 wihout glasses Left Eye Distance: 20/40 without glasses Bilateral Distance: 20/40  without glasses  Right Eye Near:   Left Eye Near:    Bilateral Near:      Physical Exam Constitutional:      General: She is not in acute distress.    Appearance: Normal appearance. She is well-developed. She is not ill-appearing, toxic-appearing or diaphoretic.  HENT:     Head: Normocephalic and atraumatic.     Nose: Nose normal.     Mouth/Throat:     Mouth: Mucous membranes are moist.     Pharynx: Oropharynx is clear.  Eyes:     General: Lids are everted, no foreign bodies appreciated. No scleral icterus.       Right eye: Discharge present. No foreign body or hordeolum.        Left eye: No discharge.     Extraocular Movements: Extraocular movements intact.     Right eye: Normal extraocular motion.     Conjunctiva/sclera:     Right eye: Right conjunctiva is injected. No chemosis, exudate or hemorrhage.    Pupils: Pupils are equal, round, and reactive to light.  Cardiovascular:     Rate and Rhythm: Normal rate.  Pulmonary:     Effort: Pulmonary effort is normal.  Skin:    General: Skin is warm and dry.  Neurological:  General: No focal deficit present.     Mental Status: She is alert and oriented to person, place, and time.  Psychiatric:        Mood and Affect: Mood normal.        Behavior: Behavior normal.        Thought Content: Thought content normal.        Judgment: Judgment normal.      Assessment and Plan :   PDMP not reviewed this encounter.  1. Pain of right eye   2. Acute bacterial conjunctivitis of right eye   3. Hordeolum externum of right lower eyelid     Start erythromycin ointment to cover for infectious process secondary to patient picking at her stye.  Patient successfully reduce hordeolum. Counseled patient on potential for adverse effects with medications prescribed/recommended today, ER and return-to-clinic precautions discussed, patient verbalized understanding.    Wallis Bamberg, PA-C 11/23/19 1109

## 2019-11-23 NOTE — ED Triage Notes (Signed)
Pt here for watering of right eye; pt sts recent hx of stye on same eye

## 2020-01-11 ENCOUNTER — Ambulatory Visit: Payer: Medicaid Other

## 2020-06-26 ENCOUNTER — Ambulatory Visit: Payer: Medicaid Other | Admitting: Student

## 2021-02-01 ENCOUNTER — Emergency Department (HOSPITAL_COMMUNITY): Payer: Medicaid Other

## 2021-02-01 ENCOUNTER — Other Ambulatory Visit: Payer: Self-pay

## 2021-02-01 ENCOUNTER — Encounter (HOSPITAL_COMMUNITY): Payer: Self-pay | Admitting: Emergency Medicine

## 2021-02-01 ENCOUNTER — Emergency Department (HOSPITAL_COMMUNITY)
Admission: EM | Admit: 2021-02-01 | Discharge: 2021-02-02 | Disposition: A | Discharge status: Home / Self Care | Payer: Medicaid Other | Attending: Emergency Medicine | Admitting: Emergency Medicine

## 2021-02-01 DIAGNOSIS — Z20822 Contact with and (suspected) exposure to covid-19: Secondary | ICD-10-CM | POA: Diagnosis not present

## 2021-02-01 DIAGNOSIS — F17210 Nicotine dependence, cigarettes, uncomplicated: Secondary | ICD-10-CM | POA: Diagnosis not present

## 2021-02-01 DIAGNOSIS — R079 Chest pain, unspecified: Secondary | ICD-10-CM | POA: Diagnosis not present

## 2021-02-01 DIAGNOSIS — R519 Headache, unspecified: Secondary | ICD-10-CM | POA: Diagnosis not present

## 2021-02-01 DIAGNOSIS — B349 Viral infection, unspecified: Secondary | ICD-10-CM | POA: Insufficient documentation

## 2021-02-01 DIAGNOSIS — R197 Diarrhea, unspecified: Secondary | ICD-10-CM | POA: Diagnosis not present

## 2021-02-01 MED ORDER — ACETAMINOPHEN 500 MG PO TABS
1000.0000 mg | ORAL_TABLET | Freq: Once | ORAL | Status: AC
  Administered 2021-02-02: 1000 mg via ORAL
  Filled 2021-02-01 (×2): qty 2

## 2021-02-01 NOTE — ED Provider Notes (Signed)
Emergency Medicine Provider Triage Evaluation Note  JANAY CANAN , a 45 y.o. female  was evaluated in triage.  Pt complains of headache, body aches, fever, diarrhea x2 days.  No known COVID exposures, patient is not vaccinated.  She is a daily smoker, no history of asthma or COPD.Marland Kitchen  Review of Systems  Positive: Body aches, fever, diarrhea Negative: Chest pain, shortness of breath  Physical Exam  There were no vitals taken for this visit. Gen:   Awake, no distress   Resp:  Normal effort, mild wheezing on exam MSK:   Moves extremities without difficulty  Other:    Medical Decision Making  Medically screening exam initiated at 9:28 PM.  Appropriate orders placed.  CHARLEEN MADERA was informed that the remainder of the evaluation will be completed by another provider, this initial triage assessment does not replace that evaluation, and the importance of remaining in the ED until their evaluation is complete.  We will test for COVID and get a chest x-ray given she had some adventitious lung sounds.  Could this be from long-term smoking.   Theron Arista, PA-C 02/01/21 2130    Gloris Manchester, MD 02/03/21 [phone]

## 2021-02-01 NOTE — ED Triage Notes (Signed)
Pt reports headache, fever, diarrhea, fatigue and COVID exposure.

## 2021-02-02 DIAGNOSIS — R519 Headache, unspecified: Secondary | ICD-10-CM | POA: Diagnosis not present

## 2021-02-02 DIAGNOSIS — Z20822 Contact with and (suspected) exposure to covid-19: Secondary | ICD-10-CM | POA: Diagnosis not present

## 2021-02-02 DIAGNOSIS — B349 Viral infection, unspecified: Secondary | ICD-10-CM | POA: Diagnosis not present

## 2021-02-02 LAB — SARS CORONAVIRUS 2 (TAT 6-24 HRS): SARS Coronavirus 2: NEGATIVE

## 2021-02-02 MED ORDER — KETOROLAC TROMETHAMINE 60 MG/2ML IM SOLN
60.0000 mg | Freq: Once | INTRAMUSCULAR | Status: AC
  Administered 2021-02-02: 60 mg via INTRAMUSCULAR
  Filled 2021-02-02: qty 2

## 2021-02-02 MED ORDER — PROMETHAZINE HCL 25 MG PO TABS: 25.0000 mg | ORAL_TABLET | Freq: Three times a day (TID) | ORAL | 0 refills | Status: DC | PRN

## 2021-02-02 NOTE — ED Notes (Signed)
Discharge instructions reviewed with patient and family member. Patient and family member verbalized understanding of instructions. Follow-up care and medications were reviewed. Patient ambulatory with steady gait. VSS upon discharge.  °

## 2021-02-02 NOTE — ED Provider Notes (Signed)
[facility] EMERGENCY DEPARTMENT Provider Note   CSN: [id number] Arrival date & time: 02/01/21  2116     History Chief Complaint  Patient presents with   Headache   Diarrhea    Alicia [name] is a 45 y.o. female.  The history is provided by the patient.  Headache Associated symptoms: diarrhea   Diarrhea Associated symptoms: headaches   Alicia [name] is a 45 y.o. female who presents to the Emergency Department complaining of HA, cough.  She presents to the ED complaining of HA, cough, diarrhea, body aches that started Thursday.  Has poor appetite.  Diarrhea is described as watery, nonbloody, occurs when she urinates.    Has associated fever.  Had sore throat - now resolved.  No nausea/vomiting, chest pain, abdominal pain, dysuria.    No known medical problems.  No chance of pregnancy.  Granddaughter was vomiting on Thursday and patient became sick a few hours later.  No definite covid exposure.      Past Medical History:  Diagnosis Date   Anemia    No pertinent past medical history     Patient Active Problem List   Diagnosis Date Noted   VBAC, delivered 02/09/2013   BV (bacterial vaginosis) 09/02/2012   Skin abscess 07/03/2012    Past Surgical History:  Procedure Laterality Date   CESAREAN SECTION     FOOT SURGERY     INCISION AND DRAINAGE INTRA ORAL ABSCESS Right 07/06/2012     OB History     Gravida  7   Para  6   Term  5   Preterm  1   AB  1   Living  6      SAB  1   IAB      Ectopic      Multiple      Live Births  5           Family History  Problem Relation Age of Onset   Diabetes Paternal Grandmother    Congenital heart disease Son    Hypertension Mother    Other Neg Hx    Breast cancer Neg Hx     Social History   Tobacco Use   Smoking status: Every Day    Packs/day: 0.50    Types: Cigarettes   Smokeless tobacco: Never  Vaping Use   Vaping Use: Never used  Substance Use Topics   Alcohol use:  Yes    Comment: 2 drinks per week   Drug use: No    Home Medications Prior to Admission medications   Medication Sig Start Date End Date Taking? Authorizing Provider  promethazine (PHENERGAN) 25 MG tablet Take 1 tablet (25 mg total) by mouth every 8 (eight) hours as needed for nausea or vomiting. 02/02/21  Yes Tilden Fossa, MD  erythromycin ophthalmic ointment Place a 1/2 inch ribbon of ointment into the lower right eyelid once every 4 hours. 11/23/19   Wallis Bamberg, PA-C  fluconazole (DIFLUCAN) 150 MG tablet Take 1 tablet today. Take 2nd tablet in 72 hours. Patient not taking: Reported on 06/24/2018 02/24/18   Arvilla Market, MD  metroNIDAZOLE (METROGEL) 0.75 % vaginal gel Place 1 Applicatorful vaginally at bedtime. Apply one applicatorful to vagina at bedtime for 5 days Patient not taking: Reported on 11/23/2019 10/26/19   Sharyon Cable, CNM    Allergies    Patient has no known allergies.  Review of Systems   Review of Systems  Gastrointestinal:  Positive for  diarrhea.  Neurological:  Positive for headaches.  All other systems reviewed and are negative.  Physical Exam Updated Vital Signs BP (!) 142/89 (BP Location: Right Arm)   Pulse 66   Temp 98.4 F (36.9 C) (Oral)   Resp 14   SpO2 98%   Physical Exam Vitals and nursing note reviewed.  Constitutional:      Appearance: She is well-developed.  HENT:     Head: Normocephalic and atraumatic.  Cardiovascular:     Rate and Rhythm: Normal rate and regular rhythm.     Heart sounds: No murmur heard. Pulmonary:     Effort: Pulmonary effort is normal. No respiratory distress.     Breath sounds: Normal breath sounds.  Abdominal:     Palpations: Abdomen is soft.     Tenderness: There is no abdominal tenderness. There is no guarding or rebound.  Musculoskeletal:        General: No tenderness.     Cervical back: Neck supple.  Skin:    General: Skin is warm and dry.  Neurological:     Mental Status: She is alert and  oriented to person, place, and time.  Psychiatric:        Behavior: Behavior normal.    ED Results / Procedures / Treatments   Labs (all labs ordered are listed, but only abnormal results are displayed) Labs Reviewed  SARS CORONAVIRUS 2 (TAT 6-24 HRS)    EKG None  Radiology DG Chest 2 View  Result Date: 02/01/2021 CLINICAL DATA:  Chest pain, headache and diarrhea. EXAM: CHEST - 2 VIEW COMPARISON:  December 18, 2014 FINDINGS: The heart size and mediastinal contours are within normal limits. Both lungs are clear. The visualized skeletal structures are unremarkable. IMPRESSION: No active cardiopulmonary disease. Electronically Signed   By: Aram Candela M.D.   On: 02/01/2021 22:30    Procedures Procedures   Medications Ordered in ED Medications  acetaminophen (TYLENOL) tablet 1,000 mg (1,000 mg Oral Given 02/02/21 0735)  ketorolac (TORADOL) injection 60 mg (60 mg Intramuscular Given 02/02/21 0735)    ED Course  I have reviewed the triage vital signs and the nursing notes.  Pertinent labs & imaging results that were available during my care of the patient were reviewed by me and considered in my medical decision making (see chart for details).    MDM Rules/Calculators/A&P                          patient here for evaluation of headache, body aches, cough, diarrhea. She is febrile on ED presentation, improved on recheck. Her COVID-19 test is negative today. She is non-toxic appearing on evaluation and well hydrated. Presentation is not consistent with sepsis, meningitis, pneumonia. Discussed with patient likely viral infection. Discussed home care, outpatient follow-up and return precautions.  Final Clinical Impression(s) / ED Diagnoses Final diagnoses:  Viral illness    Rx / DC Orders ED Discharge Orders          Ordered    promethazine (PHENERGAN) 25 MG tablet  Every 8 hours PRN        02/02/21 0521             Tilden Fossa, MD 02/02/21 0745

## 2021-02-03 ENCOUNTER — Telehealth: Payer: Self-pay

## 2021-02-03 NOTE — Telephone Encounter (Signed)
Transition Care Management Unsuccessful Follow-up Telephone Call  Date of discharge and from where:  02/02/2021 from [place]  Attempts:  1st Attempt  Reason for unsuccessful TCM follow-up call:  Left voice message    

## 2021-02-04 NOTE — Telephone Encounter (Signed)
Transition Care Management Unsuccessful Follow-up Telephone Call  Date of discharge and from where:  02/02/2021 from [place]  Attempts:  2nd Attempt  Reason for unsuccessful TCM follow-up call:  Left voice message    

## 2021-02-05 NOTE — Telephone Encounter (Signed)
Transition Care Management Follow-up Telephone Call Date of discharge and from where: 02/02/2021 from [facility] How have you been since you were released from the hospital? Pt stated that she was feeling some better and did not have any questions or concerns at this time. Pt stated that has been to keep fluids and food down and she is not experiencing any chest pain, SOB or DOE.  Any questions or concerns? No  Items Reviewed: Did the pt receive and understand the discharge instructions provided? Yes  Medications obtained and verified? Yes  Other? No  Any new allergies since your discharge? No  Dietary orders reviewed? No Do you have support at home? Yes   Functional Questionnaire: (I = Independent and D = Dependent) ADLs: I  Bathing/Dressing- I  Meal Prep- I  Eating- I  Maintaining continence- I  Transferring/Ambulation- I  Managing Meds- I   Follow up appointments reviewed:  PCP Hospital f/u appt confirmed? No   Specialist Hospital f/u appt confirmed? No   Are transportation arrangements needed? No  If their condition worsens, is the pt aware to call PCP or go to the Emergency Dept.? Yes Was the patient provided with contact information for the PCP's office or ED? Yes Was to pt encouraged to call back with questions or concerns? Yes

## 2021-05-02 ENCOUNTER — Ambulatory Visit: Payer: Medicaid Other | Admitting: Student

## 2021-06-23 ENCOUNTER — Encounter (HOSPITAL_COMMUNITY): Payer: Self-pay

## 2021-06-23 ENCOUNTER — Ambulatory Visit (HOSPITAL_COMMUNITY)
Admission: EM | Admit: 2021-06-23 | Discharge: 2021-06-23 | Disposition: A | Discharge status: Home / Self Care | Payer: Medicaid Other | Attending: Medical Oncology | Admitting: Medical Oncology

## 2021-06-23 ENCOUNTER — Other Ambulatory Visit: Payer: Self-pay

## 2021-06-23 DIAGNOSIS — J020 Streptococcal pharyngitis: Secondary | ICD-10-CM

## 2021-06-23 MED ORDER — AMOXICILLIN 500 MG PO CAPS: 500.0000 mg | ORAL_CAPSULE | Freq: Two times a day (BID) | ORAL | 0 refills | Status: DC

## 2021-06-23 MED ORDER — AMOXICILLIN-POT CLAVULANATE 875-125 MG PO TABS: 1.0000 | ORAL_TABLET | Freq: Two times a day (BID) | ORAL | 0 refills | Status: DC

## 2021-06-23 NOTE — ED Provider Notes (Signed)
[place]    CSN: [id number] Arrival date & time: 06/23/21  1011      History   Chief Complaint Chief Complaint  Patient presents with   Sore Throat    HPI Alicia [name] is a 46 y.o. female.   HPI  Sore Throat: Patient reports that they have had symptoms of sore throat for the past 14 days. Symptoms are stable. They deny SOB, chest pain, fever or vomiting. They have tried "a pain pill" for symptoms. No known sick contacts.    Past Medical History:  Diagnosis Date   Anemia    No pertinent past medical history     Patient Active Problem List   Diagnosis Date Noted   VBAC, delivered 02/09/2013   BV (bacterial vaginosis) 09/02/2012   Skin abscess 07/03/2012    Past Surgical History:  Procedure Laterality Date   CESAREAN SECTION     FOOT SURGERY     INCISION AND DRAINAGE INTRA ORAL ABSCESS Right 07/06/2012    OB History     Gravida  7   Para  6   Term  5   Preterm  1   AB  1   Living  6      SAB  1   IAB      Ectopic      Multiple      Live Births  5            Home Medications    Prior to Admission medications   Medication Sig Start Date End Date Taking? Authorizing Provider  erythromycin ophthalmic ointment Place a 1/2 inch ribbon of ointment into the lower right eyelid once every 4 hours. 11/23/19   Jaynee Eagles, PA-C  fluconazole (DIFLUCAN) 150 MG tablet Take 1 tablet today. Take 2nd tablet in 72 hours. Patient not taking: Reported on 06/24/2018 02/24/18   Nicolette Bang, MD  metroNIDAZOLE (METROGEL) 0.75 % vaginal gel Place 1 Applicatorful vaginally at bedtime. Apply one applicatorful to vagina at bedtime for 5 days Patient not taking: Reported on 11/23/2019 10/26/19   Lajean Manes, CNM  promethazine (PHENERGAN) 25 MG tablet Take 1 tablet (25 mg total) by mouth every 8 (eight) hours as needed for nausea or vomiting. 02/02/21   Quintella Reichert, MD    Family History Family History  Problem Relation Age of  Onset   Diabetes Paternal Grandmother    Congenital heart disease Son    Hypertension Mother    Other Neg Hx    Breast cancer Neg Hx     Social History Social History   Tobacco Use   Smoking status: Every Day    Packs/day: 0.50    Types: Cigarettes   Smokeless tobacco: Never  Vaping Use   Vaping Use: Never used  Substance Use Topics   Alcohol use: Yes    Comment: 2 drinks per week   Drug use: No     Allergies   Patient has no known allergies.   Review of Systems Review of Systems  As stated above in HPI Physical Exam Triage Vital Signs ED Triage Vitals  Enc Vitals Group     BP 06/23/21 1039 (!) 160/115     Pulse Rate 06/23/21 1039 75     Resp 06/23/21 1039 17     Temp 06/23/21 1039 98.5 F (36.9 C)     Temp Source 06/23/21 1039 Oral     SpO2 06/23/21 1039 98 %     Weight --  Height --      Head Circumference --      Peak Flow --      Pain Score 06/23/21 1040 9     Pain Loc --      Pain Edu? --      Excl. in [place]? --    No data found.  Updated Vital Signs BP (!) 160/115 (BP Location: Right Arm)    Pulse 75    Temp 98.5 F (36.9 C) (Oral)    Resp 17    SpO2 98%   Physical Exam Vitals and nursing note reviewed.  Constitutional:      General: She is not in acute distress.    Appearance: She is well-developed. She is not ill-appearing, toxic-appearing or diaphoretic.  HENT:     Head: Normocephalic and atraumatic.     Comments: Some reduction of jaw ROM due to pain    Right Ear: Tympanic membrane normal. No middle ear effusion. Tympanic membrane is not erythematous.     Left Ear: Tympanic membrane normal.  No middle ear effusion. Tympanic membrane is not erythematous.     Nose: Congestion present. No rhinorrhea.     Mouth/Throat:     Mouth: Mucous membranes are moist.     Pharynx: Uvula midline. Oropharyngeal exudate and posterior oropharyngeal erythema present. No pharyngeal swelling or uvula swelling.     Tonsils: Tonsillar exudate present. 3+ on  the right. 3+ on the left.  Eyes:     Conjunctiva/sclera: Conjunctivae normal.  Cardiovascular:     Rate and Rhythm: Normal rate and regular rhythm.     Heart sounds: Normal heart sounds.  Pulmonary:     Effort: Pulmonary effort is normal.     Breath sounds: Normal breath sounds.  Musculoskeletal:     Cervical back: Neck supple.  Lymphadenopathy:     Cervical: Cervical adenopathy present.  Skin:    General: Skin is warm.  Neurological:     Mental Status: She is alert and oriented to person, place, and time.     UC Treatments / Results  Labs (all labs ordered are listed, but only abnormal results are displayed) Labs Reviewed - No data to display  EKG   Radiology No results found.  Procedures Procedures (including critical care time)  Medications Ordered in UC Medications - No data to display  Initial Impression / Assessment and Plan / UC Course  I have reviewed the triage vital signs and the nursing notes.  Pertinent labs & imaging results that were available during my care of the patient were reviewed by me and considered in my medical decision making (see chart for details).     New. Treating with Augmentin for suspected strep pharyngitis due to clinical picture and history. Could also be due to dental infection due to poor dentition so the Augmentin will cover for this as well as offer broader coverage for other bacteria. Due to severity of symptoms we elected to avoid worsening condition with strep swab. Discussed red flag signs and symptoms. Follow up as needed.  Final Clinical Impressions(s) / UC Diagnoses   Final diagnoses:  None   Discharge Instructions   None    ED Prescriptions   None    PDMP not reviewed this encounter.   Hughie Closs, [place] 06/23/21 1053

## 2021-06-23 NOTE — ED Triage Notes (Signed)
Pt presents with sore throat X 2 weeks. 

## 2021-07-03 ENCOUNTER — Ambulatory Visit: Payer: Medicaid Other | Admitting: Student

## 2023-01-29 ENCOUNTER — Ambulatory Visit: Payer: Medicaid Other | Admitting: Obstetrics and Gynecology

## 2023-02-25 ENCOUNTER — Ambulatory Visit: Payer: Medicaid Other | Admitting: Family Medicine

## 2023-03-06 ENCOUNTER — Encounter (HOSPITAL_COMMUNITY): Payer: Self-pay | Admitting: *Deleted

## 2023-03-06 ENCOUNTER — Other Ambulatory Visit: Payer: Self-pay

## 2023-03-06 ENCOUNTER — Emergency Department (HOSPITAL_COMMUNITY)
Admission: EM | Admit: 2023-03-06 | Discharge: 2023-03-06 | Disposition: A | Discharge status: Home / Self Care | Payer: Medicaid Other | Attending: Emergency Medicine | Admitting: Emergency Medicine

## 2023-03-06 DIAGNOSIS — L299 Pruritus, unspecified: Secondary | ICD-10-CM | POA: Diagnosis present

## 2023-03-06 DIAGNOSIS — T7840XA Allergy, unspecified, initial encounter: Secondary | ICD-10-CM

## 2023-03-06 MED ORDER — PREDNISONE 10 MG PO TABS: 20.0000 mg | ORAL_TABLET | Freq: Every day | ORAL | 0 refills | Status: AC

## 2023-03-06 MED ORDER — DIPHENHYDRAMINE HCL 25 MG PO TABS: 25.0000 mg | ORAL_TABLET | Freq: Four times a day (QID) | ORAL | 0 refills | Status: AC

## 2023-03-06 MED ORDER — DIPHENHYDRAMINE HCL 12.5 MG/5ML PO ELIX
25.0000 mg | ORAL_SOLUTION | Freq: Once | ORAL | Status: AC
  Administered 2023-03-06: 25 mg via ORAL
  Filled 2023-03-06: qty 10

## 2023-03-06 MED ORDER — FAMOTIDINE 20 MG PO TABS: 20.0000 mg | ORAL_TABLET | Freq: Two times a day (BID) | ORAL | 0 refills | Status: AC

## 2023-03-06 MED ORDER — EPINEPHRINE 0.3 MG/0.3ML IJ SOAJ: 0.3000 mg | INTRAMUSCULAR | 0 refills | Status: AC | PRN

## 2023-03-06 MED ORDER — FAMOTIDINE 20 MG PO TABS
20.0000 mg | ORAL_TABLET | Freq: Once | ORAL | Status: AC
  Administered 2023-03-06: 20 mg via ORAL
  Filled 2023-03-06: qty 1

## 2023-03-06 MED ORDER — PREDNISONE 20 MG PO TABS
60.0000 mg | ORAL_TABLET | Freq: Once | ORAL | Status: AC
  Administered 2023-03-06: 60 mg via ORAL
  Filled 2023-03-06: qty 3

## 2023-03-06 NOTE — Discharge Instructions (Addendum)
Please take your medications as prescribed. I recommend close follow-up with PCP and [facility] for reevaluation.  Please do not hesitate to return to emergency department if worrisome signs symptoms we discussed become apparent.

## 2023-03-06 NOTE — ED Triage Notes (Signed)
C/o waking up this am with both her lips swelling  states she had the same thing happen 3 weeks ago it went away, using OTC Benadryl this am with some relief, denies any difficulty swallowing.

## 2023-03-06 NOTE — ED Provider Notes (Signed)
[place] EMERGENCY DEPARTMENT AT [facility] Provider Note   CSN: [id number] Arrival date & time: 03/06/23  1026     History  Chief Complaint  Patient presents with   Allergic Reaction    Alicia [name] is a 47 y.o. female history of anemia presents today for evaluation of an allergic reaction.  Patient reports waking up this morning with swollen lips, eyes and itching on her hands.  Patient had similar symptoms 2 weeks ago which resolved with Benadryl.  Patient took a dose of Benadryl this morning with some relief.  She denies any tongue swelling, trouble breathing, swallowing.  Denies any fever, nausea vomiting, chest pain, shortness of breath, headache, lightheadedness.  She denies any recent medication changes, new detergents or known allergens.   Allergic Reaction   Past Medical History:  Diagnosis Date   Anemia    No pertinent past medical history    Past Surgical History:  Procedure Laterality Date   CESAREAN SECTION     FOOT SURGERY     INCISION AND DRAINAGE INTRA ORAL ABSCESS Right 07/06/2012     Home Medications Prior to Admission medications   Medication Sig Start Date End Date Taking? Authorizing Provider  amoxicillin-clavulanate (AUGMENTIN) 875-125 MG tablet Take 1 tablet by mouth every 12 (twelve) hours. 06/23/21   Rushie Chestnut, PA-C  erythromycin ophthalmic ointment Place a 1/2 inch ribbon of ointment into the lower right eyelid once every 4 hours. 11/23/19   Wallis Bamberg, PA-C  fluconazole (DIFLUCAN) 150 MG tablet Take 1 tablet today. Take 2nd tablet in 72 hours. Patient not taking: Reported on 06/24/2018 02/24/18   Arvilla Market, MD  metroNIDAZOLE (METROGEL) 0.75 % vaginal gel Place 1 Applicatorful vaginally at bedtime. Apply one applicatorful to vagina at bedtime for 5 days Patient not taking: Reported on 11/23/2019 10/26/19   Sharyon Cable, CNM  promethazine (PHENERGAN) 25 MG tablet Take 1 tablet (25 mg total) by mouth every 8  (eight) hours as needed for nausea or vomiting. 02/02/21   Tilden Fossa, MD      Allergies    Patient has no known allergies.    Review of Systems   Review of Systems Negative except as per HPI.  Physical Exam Updated Vital Signs BP (!) 166/105 (BP Location: Right Arm)   Pulse 76   Temp 98.3 F (36.8 C)   Resp 16   Ht 5\' 3"  (1.6 m)   Wt 55.7 kg   SpO2 100%   BMI 21.75 kg/m  Physical Exam Vitals and nursing note reviewed.  Constitutional:      Appearance: Normal appearance.  HENT:     Head: Normocephalic and atraumatic.     Mouth/Throat:     Mouth: Mucous membranes are moist.     Comments: Swelling to upper and lower lips. Eyes:     General: No scleral icterus.    Comments: Mild swelling to bilateral eyes.  Cardiovascular:     Rate and Rhythm: Normal rate and regular rhythm.     Pulses: Normal pulses.     Heart sounds: Normal heart sounds.  Pulmonary:     Effort: Pulmonary effort is normal.     Breath sounds: Normal breath sounds.  Abdominal:     General: Abdomen is flat.     Palpations: Abdomen is soft.     Tenderness: There is no abdominal tenderness.  Musculoskeletal:        General: No deformity.  Skin:    General: Skin  is warm.     Findings: No rash.  Neurological:     General: No focal deficit present.     Mental Status: She is alert.  Psychiatric:        Mood and Affect: Mood normal.     ED Results / Procedures / Treatments   Labs (all labs ordered are listed, but only abnormal results are displayed) Labs Reviewed - No data to display  EKG None  Radiology No results found.  Procedures Procedures    Medications Ordered in ED Medications  diphenhydrAMINE (BENADRYL) 12.5 MG/5ML elixir 25 mg (has no administration in time range)  predniSONE (DELTASONE) tablet 60 mg (has no administration in time range)  famotidine (PEPCID) tablet 20 mg (has no administration in time range)    ED Course/ Medical Decision Making/ A&P                                  Medical Decision Making Risk Prescription drug management.   This patient presents to the ED for allergic reaction, this involves an extensive number of treatment options, and is a complaint that carries with a high risk of complications and morbidity.  The differential diagnosis includes allergic reaction, anaphylaxis.  This is not an exhaustive list.  Problem list/ ED course/ Critical interventions/ Medical management: HPI: See above Vital signs within normal range and stable throughout visit. Laboratory/imaging studies significant for: See above. On physical examination, patient is afebrile and appears in no acute distress. This patient presents with symptoms consistent with acute hypersensitivity reaction, likely acute allergic reaction. Presentation not consistent with acute anaphylaxis (lack of pulmonary, dermatologic, cardiovascular or GI symptoms, lack of hypotension or exposure to known allergen), serum sickness (no recent drug exposure, lacks fevers, arthralgias). No evidence of airway compromise or shock at this time. Patient improved with H1/H2 blockers, steroids. No need for epinephrine. Prescribed patient EpiPen Rx, and patient to keep food diary, and to follow up with PCP for allergy testing. I have reviewed the patient home medicines and have made adjustments as needed.  Cardiac monitoring/EKG: The patient was maintained on a cardiac monitor.  I personally reviewed and interpreted the cardiac monitor which showed an underlying rhythm of: sinus rhythm.  Additional history obtained: External records from outside source obtained and reviewed including: Chart review including previous notes, labs, imaging.  Consultations obtained:  Disposition Continued outpatient therapy. Follow-up with PCP recommended for reevaluation of symptoms. Treatment plan discussed with patient.  Pt acknowledged understanding was agreeable to the plan. Worrisome signs and symptoms were  discussed with patient, and patient acknowledged understanding to return to the ED if they noticed these signs and symptoms. Patient was stable upon discharge.   This chart was dictated using voice recognition software.  Despite best efforts to proofread,  errors can occur which can change the documentation meaning.          Final Clinical Impression(s) / ED Diagnoses Final diagnoses:  Allergic reaction, initial encounter    Rx / DC Orders ED Discharge Orders          Ordered    diphenhydrAMINE (BENADRYL) 25 MG tablet  Every 6 hours        03/06/23 1343    predniSONE (DELTASONE) 10 MG tablet  Daily        03/06/23 1343    famotidine (PEPCID) 20 MG tablet  2 times daily        03/06/23  1343    EPINEPHrine 0.3 mg/0.3 mL IJ SOAJ injection  As needed        03/06/23 1343              Jeanelle Malling, [place] 03/06/23 1353    Wynetta Fines, MD 03/06/23 [phone]

## 2023-03-09 NOTE — Plan of Care (Signed)
[facility] Tonsillectomy/Adenoidectomy, Postoperative PEDS care plan entered in error.

## 2023-03-25 ENCOUNTER — Encounter (HOSPITAL_COMMUNITY): Payer: Self-pay | Admitting: Emergency Medicine

## 2023-03-25 ENCOUNTER — Ambulatory Visit (HOSPITAL_COMMUNITY)
Admission: EM | Admit: 2023-03-25 | Discharge: 2023-03-25 | Disposition: A | Discharge status: Home / Self Care | Payer: Medicaid Other | Attending: Emergency Medicine | Admitting: Emergency Medicine

## 2023-03-25 DIAGNOSIS — Z113 Encounter for screening for infections with a predominantly sexual mode of transmission: Secondary | ICD-10-CM | POA: Insufficient documentation

## 2023-03-25 LAB — HIV ANTIBODY (ROUTINE TESTING W REFLEX): HIV Screen 4th Generation wRfx: NONREACTIVE

## 2023-03-25 NOTE — Discharge Instructions (Addendum)
We have screened you for sexually transmitted infections and results will be available over the next few days.  Staff will contact you if anything is positive to initiate the appropriate treatment.  Abstain from intercourse until results have been received and notify any sexual partners of any positives.

## 2023-03-25 NOTE — ED Provider Notes (Signed)
[facility]    CSN: [id number] Arrival date & time: 03/25/23  1113      History   Chief Complaint Chief Complaint  Patient presents with   SEXUALLY TRANSMITTED DISEASE    HPI Alicia [name] is a 47 y.o. female.   Patient presents to clinic requesting screening for sexually transmitted infections.  She has not had any new partners recently and denies any symptoms, which is like to be checked.  Would like HIV and syphilis screening.  Denies dysuria, vaginal discharge, vaginal sores, lesions or odor.  The history is provided by the patient and medical records.    Past Medical History:  Diagnosis Date   Anemia    No pertinent past medical history     Patient Active Problem List   Diagnosis Date Noted   VBAC, delivered 02/09/2013   BV (bacterial vaginosis) 09/02/2012   Skin abscess 07/03/2012    Past Surgical History:  Procedure Laterality Date   CESAREAN SECTION     FOOT SURGERY     INCISION AND DRAINAGE INTRA ORAL ABSCESS Right 07/06/2012    OB History     Gravida  7   Para  6   Term  5   Preterm  1   AB  1   Living  6      SAB  1   IAB      Ectopic      Multiple      Live Births  5            Home Medications    Prior to Admission medications   Medication Sig Start Date End Date Taking? Authorizing Provider  amoxicillin-clavulanate (AUGMENTIN) 875-125 MG tablet Take 1 tablet by mouth every 12 (twelve) hours. 06/23/21   Rushie Chestnut, PA-C  diphenhydrAMINE (BENADRYL) 25 MG tablet Take 1 tablet (25 mg total) by mouth every 6 (six) hours. 03/06/23   Jeanelle Malling, PA  EPINEPHrine 0.3 mg/0.3 mL IJ SOAJ injection Inject 0.3 mg into the muscle as needed for anaphylaxis. 03/06/23   Jeanelle Malling, PA  erythromycin ophthalmic ointment Place a 1/2 inch ribbon of ointment into the lower right eyelid once every 4 hours. 11/23/19   Wallis Bamberg, PA-C  famotidine (PEPCID) 20 MG tablet Take 1 tablet (20 mg total) by mouth 2 (two) times daily.  03/06/23   Jeanelle Malling, PA  fluconazole (DIFLUCAN) 150 MG tablet Take 1 tablet today. Take 2nd tablet in 72 hours. Patient not taking: Reported on 06/24/2018 02/24/18   Arvilla Market, MD  metroNIDAZOLE (METROGEL) 0.75 % vaginal gel Place 1 Applicatorful vaginally at bedtime. Apply one applicatorful to vagina at bedtime for 5 days Patient not taking: Reported on 11/23/2019 10/26/19   Sharyon Cable, CNM  promethazine (PHENERGAN) 25 MG tablet Take 1 tablet (25 mg total) by mouth every 8 (eight) hours as needed for nausea or vomiting. 02/02/21   Tilden Fossa, MD    Family History Family History  Problem Relation Age of Onset   Diabetes Paternal Grandmother    Congenital heart disease Son    Hypertension Mother    Other Neg Hx    Breast cancer Neg Hx     Social History Social History   Tobacco Use   Smoking status: Every Day    Current packs/day: 0.50    Types: Cigarettes   Smokeless tobacco: Never  Vaping Use   Vaping status: Never Used  Substance Use Topics   Alcohol use: Yes  Comment: 2 drinks per week   Drug use: No     Allergies   Patient has no known allergies.   Review of Systems Review of Systems   Physical Exam Triage Vital Signs ED Triage Vitals  Encounter Vitals Group     BP 03/25/23 1211 (!) 156/100     Systolic BP Percentile --      Diastolic BP Percentile --      Pulse Rate 03/25/23 1211 83     Resp 03/25/23 1211 16     Temp 03/25/23 1211 97.9 F (36.6 C)     Temp Source 03/25/23 1211 Oral     SpO2 03/25/23 1211 96 %     Weight --      Height --      Head Circumference --      Peak Flow --      Pain Score 03/25/23 1212 0     Pain Loc --      Pain Education --      Exclude from Growth Chart --    No data found.  Updated Vital Signs BP (!) 156/100 (BP Location: Left Arm)   Pulse 83   Temp 97.9 F (36.6 C) (Oral)   Resp 16   SpO2 96%   Visual Acuity Right Eye Distance:   Left Eye Distance:   Bilateral Distance:    Right  Eye Near:   Left Eye Near:    Bilateral Near:     Physical Exam Vitals and nursing note reviewed.  Constitutional:      Appearance: Normal appearance.  HENT:     Head: Normocephalic and atraumatic.     Right Ear: External ear normal.     Left Ear: External ear normal.     Nose: Nose normal.     Mouth/Throat:     Mouth: Mucous membranes are moist.  Eyes:     Conjunctiva/sclera: Conjunctivae normal.  Cardiovascular:     Rate and Rhythm: Normal rate.  Pulmonary:     Effort: Pulmonary effort is normal. No respiratory distress.  Musculoskeletal:        General: Normal range of motion.  Neurological:     General: No focal deficit present.     Mental Status: She is alert.  Psychiatric:        Mood and Affect: Mood normal.        Behavior: Behavior is cooperative.      UC Treatments / Results  Labs (all labs ordered are listed, but only abnormal results are displayed) Labs Reviewed  RPR  HIV ANTIBODY (ROUTINE TESTING W REFLEX)  CERVICOVAGINAL ANCILLARY ONLY    EKG   Radiology No results found.  Procedures Procedures (including critical care time)  Medications Ordered in UC Medications - No data to display  Initial Impression / Assessment and Plan / UC Course  I have reviewed the triage vital signs and the nursing notes.  Pertinent labs & imaging results that were available during my care of the patient were reviewed by me and considered in my medical decision making (see chart for details).  Vitals and triage reviewed, patient is hemodynamically stable.  Requesting STI screening, asymptomatic.  Cytology and lab work obtained.  Help to reset MyChart, staff will contact if anything requires treatment.  Plan of care, follow-up care return precautions given, no questions at this time.     Final Clinical Impressions(s) / UC Diagnoses   Final diagnoses:  Screening examination for sexually transmitted disease  Discharge Instructions      We have screened  you for sexually transmitted infections and results will be available over the next few days.  Staff will contact you if anything is positive to initiate the appropriate treatment.  Abstain from intercourse until results have been received and notify any sexual partners of any positives.     ED Prescriptions   None    PDMP not reviewed this encounter.   Locke Barrell, [place], [place] 03/25/23 1229

## 2023-03-25 NOTE — ED Triage Notes (Signed)
Pt presents to be tested for STDs. States she wants to be tested for all stds. Denies any symptoms

## 2023-03-26 LAB — CERVICOVAGINAL ANCILLARY ONLY
Chlamydia: NEGATIVE
Comment: NEGATIVE
Comment: NEGATIVE
Comment: NORMAL
Neisseria Gonorrhea: NEGATIVE
Trichomonas: NEGATIVE

## 2023-03-26 LAB — RPR: RPR Ser Ql: NONREACTIVE

## 2023-04-08 ENCOUNTER — Other Ambulatory Visit: Payer: Self-pay

## 2023-04-08 ENCOUNTER — Encounter: Payer: Self-pay | Admitting: Obstetrics and Gynecology

## 2023-04-08 ENCOUNTER — Ambulatory Visit: Payer: Medicaid Other | Admitting: Obstetrics and Gynecology

## 2023-04-08 ENCOUNTER — Other Ambulatory Visit: Payer: Self-pay | Admitting: Obstetrics and Gynecology

## 2023-04-08 VITALS — BP 139/98 | HR 69 | Ht 63.0 in | Wt 142.2 lb

## 2023-04-08 DIAGNOSIS — R030 Elevated blood-pressure reading, without diagnosis of hypertension: Secondary | ICD-10-CM | POA: Diagnosis not present

## 2023-04-08 DIAGNOSIS — N644 Mastodynia: Secondary | ICD-10-CM | POA: Diagnosis not present

## 2023-04-08 DIAGNOSIS — Z1231 Encounter for screening mammogram for malignant neoplasm of breast: Secondary | ICD-10-CM

## 2023-04-08 NOTE — Progress Notes (Signed)
Obstetrics and Gynecology New Patient Evaluation  Appointment Date: 04/08/2023  OBGYN Clinic: [facility]   Primary Care Provider: None  Referring Provider: Self  Chief Complaint: Annual  History of Present Illness: Alicia [name] is a 47 y.o.  [phone] (Patient's last menstrual period was 04/08/2023 (exact date).), seen for the above chief complaint. Her past medical history is significant for likely HTN  Patient interested in potentially going back on depo provera for birth control; last usage was about 3 years ago and she stopped due to weight gain and she did lose weight after coming off. Periods are currently qmonth, regular, about a week and not heavy or painful.   Patient states she has b/l breast discomfort at the inner parts. No skin changes, lumps, bumps, nipple changes.   Patient started her period today and declines pap, pelvic.    Review of Systems: Pertinent items noted in HPI and remainder of comprehensive ROS otherwise negative.   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)     VBAC, delivered 02/09/2013   12/28/12      Past Surgical History:  Past Surgical History:  Procedure Laterality Date   CESAREAN SECTION     FOOT SURGERY     INCISION AND DRAINAGE INTRA ORAL ABSCESS Right 07/06/2012    Past Obstetrical History:  OB History  Gravida Para Term Preterm AB Living  7 6 5 1 1 6   SAB IAB Ectopic Multiple Live Births  1       6    # Outcome Date GA Lbr Len/2nd Weight Sex Type Anes PTL Lv  7 Term 12/28/12 [redacted]w[redacted]d / 00:18 7 lb 1 oz (3.204 kg) M VBAC EPI  LIV  6 Preterm 02/02/06 [redacted]w[redacted]d  3 lb 8 oz (1.588 kg) M CS-LTranv EPI N LIV     Birth Comments: fetal distress, SGA. Dr Penne Lash  5 Term 05/15/05 [redacted]w[redacted]d  6 lb 7 oz (2.92 kg) F Vag-Spont EPI N LIV  4 Term 03/21/02 [redacted]w[redacted]d  6 lb  (2.722 kg) F Vag-Spont EPI N LIV  3 SAB 2002 [redacted]w[redacted]d         2 Term 04/17/99 [redacted]w[redacted]d  6 lb 10 oz (3.005 kg) F Vag-Spont  N LIV  1 Term 12/26/97 [redacted]w[redacted]d  6 lb 8 oz (2.948 kg) F Vag-Spont   LIV    Past Gynecological History: As per HPI. History of Pap Smear(s): Yes.   Last pap 2021, which was pap and hpv negative.  She is currently using no method for contraception.   Social History:  Social History   Socioeconomic History   Marital status: Single    Spouse name: Not on file   Number of children: Not on file   Years of education: Not on file   Highest education level: Not on file  Occupational History   Not on file  Tobacco Use   Smoking status: Every Day    Current packs/day: 0.50    Types: Cigarettes   Smokeless tobacco: Never  Vaping Use   Vaping status: Never Used  Substance and Sexual Activity   Alcohol use: Yes    Comment: 2 drinks per week; occ   Drug use: No   Sexual activity: Yes    Birth control/protection: Condom  Other Topics Concern   Not on file  Social History Narrative   Not on file   Social Determinants of Health   Financial Resource Strain: Not on file  Food Insecurity: Food Insecurity Present (04/08/2023)   Hunger Vital Sign    Worried About Running Out of Food in the Last Year: Never true    Ran Out of Food in the Last Year: Sometimes true  Transportation Needs: No Transportation Needs (04/08/2023)   PRAPARE - Administrator, Civil Service (Medical): No    Lack of Transportation (Non-Medical): No  Physical Activity: Not on file  Stress: Not on file  Social Connections: Not on file  Intimate Partner Violence: Not on file    Family History:  Family History  Problem Relation Age of Onset   Hypertension Mother    Diabetes Paternal Grandmother    Congenital heart disease Son    Other Neg Hx   Paternal cousin with breast cancer ?40s more than 5-10 years ago   Health Maintenance:  Mammogram(s): Yes.   Date: 2020  Medications [facility]. [place] had no medications administered during this visit. Current Outpatient Medications  Medication Sig Dispense Refill   diphenhydrAMINE (BENADRYL) 25 MG tablet Take 1 tablet (25 mg total) by mouth every 6 (six) hours. 20 tablet 0   amoxicillin-clavulanate (AUGMENTIN) 875-125 MG tablet Take 1 tablet by mouth every 12 (twelve) hours. (Patient not taking: Reported on 04/08/2023) 14 tablet 0   EPINEPHrine 0.3 mg/0.3 mL IJ SOAJ injection Inject 0.3 mg into the muscle as needed for anaphylaxis. (Patient not taking: Reported on 04/08/2023) 1 each 0   erythromycin ophthalmic ointment Place a 1/2 inch ribbon of ointment into the lower right eyelid once every 4 hours. (Patient not taking: Reported on 04/08/2023) 3.5 g 0   famotidine (PEPCID) 20 MG tablet Take 1 tablet (20 mg total) by mouth 2 (two) times daily. (Patient not taking: Reported on 04/08/2023) 30 tablet 0   fluconazole (DIFLUCAN) 150 MG tablet Take 1 tablet today. Take 2nd tablet in 72 hours. (Patient not taking: Reported on 06/24/2018) 2 tablet 0   metroNIDAZOLE (METROGEL) 0.75 % vaginal gel Place 1 Applicatorful vaginally at bedtime. Apply one applicatorful to vagina at bedtime for 5 days (Patient not taking: Reported on 11/23/2019) 70 g 1   promethazine (PHENERGAN) 25 MG tablet Take 1 tablet (25 mg total) by mouth every 8 (eight) hours as needed for nausea or vomiting. (Patient not taking: Reported on 04/08/2023) 8 tablet 0   No current facility-administered medications for this visit.    Allergies Patient has no known allergies.  Physical Exam:  Vitals:   04/08/23 1055 04/08/23 1101  BP: (!) 146/104 (!) 139/98  Pulse: 70 69  Weight: 142 lb 3.2 oz (64.5 kg)   Height: 5\' 3"  (1.6 m)   General appearance: Well nourished, well developed female in no acute distress.  Cardiovascular: normal s1 and s2.  No murmurs, rubs or gallops. Respiratory:  Clear to auscultation bilateral. Normal respiratory effort Abdomen: positive bowel sounds and  no masses, hernias; diffusely non tender to palpation, non distended Breasts: breasts appear normal, no suspicious masses, no skin or nipple changes or axillary nodes, and normal palpation. Neuro/Psych:  Normal mood and affect.  Skin:  Warm and dry.   Pelvic: patient declines.   Laboratory: none  Radiology: none  Assessment: patient  Plan:  1. Breast cancer screening by mammogram Pt points to area on lateral side of sternum where breast skin inserts. F/u diagnostic mammogram but I also told  her to make sure her breast is not ill fitting and to get fitted - MM 3D DIAGNOSTIC MAMMOGRAM BILATERAL BREAST; Future  2. Mastalgia See above  3. Transient hypertension Showed her on the computer how to sign up for new PCP appointment online  4. GYN Contraception options d/w her. RTC 6wks for depo (pt doesn't want to get today). I told her that if she decides on paragard or phexxi instead to let us know. Can defer pap until next year given she was hpv negative, too   Return in about 5 weeks (around 05/13/2023) for 5-6wk RN depo provera visit.  Future Appointments  Date Time Provider Department Center  05/18/2023 10:20 AM [facility] NURSE [facility]  05/19/2023  9:00 AM [facility] [place] 1 [facility]  05/19/2023  9:10 AM [facility] [place] 1 [facility]  05/19/2023  9:15 AM [facility] [place] 1 [facility]    Cornelia Copa MD Attending Center for [facility])

## 2023-05-18 ENCOUNTER — Ambulatory Visit: Payer: Medicaid Other | Admitting: *Deleted

## 2023-05-18 ENCOUNTER — Other Ambulatory Visit: Payer: Self-pay

## 2023-05-18 VITALS — BP 129/82 | HR 70 | Ht 63.0 in | Wt 145.4 lb

## 2023-05-18 DIAGNOSIS — Z3042 Encounter for surveillance of injectable contraceptive: Secondary | ICD-10-CM | POA: Diagnosis not present

## 2023-05-18 MED ORDER — MEDROXYPROGESTERONE ACETATE 150 MG/ML IM SUSP
150.0000 mg | Freq: Once | INTRAMUSCULAR | Status: AC
  Administered 2023-05-18: 150 mg via INTRAMUSCULAR

## 2023-05-18 NOTE — Progress Notes (Signed)
Depo Provera 150 mg IM administered as scheduled. Pt tolerated well. Next dose due 3/17-3/31/25. Last Pap 10/26/19 - negative. Next Annual Gyn exam w/pap due after 04/07/24.  Pt voiced understanding.

## 2023-05-19 ENCOUNTER — Ambulatory Visit: Payer: Medicaid Other

## 2023-05-19 ENCOUNTER — Ambulatory Visit
Admission: RE | Admit: 2023-05-19 | Discharge: 2023-05-19 | Disposition: A | Discharge status: Home / Self Care | Payer: Medicaid Other | Source: Ambulatory Visit | Attending: Obstetrics and Gynecology

## 2023-05-19 DIAGNOSIS — N644 Mastodynia: Secondary | ICD-10-CM | POA: Diagnosis not present

## 2023-05-19 DIAGNOSIS — R92323 Mammographic fibroglandular density, bilateral breasts: Secondary | ICD-10-CM | POA: Diagnosis not present

## 2023-05-19 DIAGNOSIS — Z1231 Encounter for screening mammogram for malignant neoplasm of breast: Secondary | ICD-10-CM

## 2023-06-16 ENCOUNTER — Telehealth: Payer: Self-pay

## 2023-06-16 NOTE — Telephone Encounter (Signed)
Pt called stating that she has not had a menstrual since December and has not been bleeding for six days.   Ceci Taliaferro,RN   Called pt and pt informed me that her bleeding has stopped.   Pt did not have any other questions.   Leonette Nutting

## 2023-08-03 ENCOUNTER — Ambulatory Visit: Payer: Medicaid Other | Admitting: *Deleted

## 2023-08-03 ENCOUNTER — Other Ambulatory Visit: Payer: Self-pay

## 2023-08-03 VITALS — BP 128/86 | HR 72 | Ht 63.0 in | Wt 153.9 lb

## 2023-08-03 DIAGNOSIS — Z3042 Encounter for surveillance of injectable contraceptive: Secondary | ICD-10-CM | POA: Diagnosis not present

## 2023-08-03 MED ORDER — MEDROXYPROGESTERONE ACETATE 150 MG/ML IM SUSY
150.0000 mg | PREFILLED_SYRINGE | Freq: Once | INTRAMUSCULAR | Status: AC
  Administered 2023-08-03: 150 mg via INTRAMUSCULAR

## 2023-08-03 NOTE — Progress Notes (Signed)
 Sharlot Gowda here for Depo-Provera  Injection.  Injection last given 05/18/23. Last annual exam 04/08/23. Last pap 10/26/19. Injection administered without complication. Patient will return in 3 months for next injection. Advised to schedule next appointment.  Bonita Quin [facility] 08/03/2023  10:35 AM

## 2023-09-21 ENCOUNTER — Other Ambulatory Visit: Payer: Self-pay

## 2023-09-21 ENCOUNTER — Ambulatory Visit: Admitting: Obstetrics and Gynecology

## 2023-09-21 ENCOUNTER — Other Ambulatory Visit (HOSPITAL_COMMUNITY)
Admission: RE | Admit: 2023-09-21 | Discharge: 2023-09-21 | Disposition: A | Discharge status: Home / Self Care | Source: Ambulatory Visit | Attending: Obstetrics and Gynecology | Admitting: Obstetrics and Gynecology

## 2023-09-21 VITALS — BP 122/87 | HR 82 | Wt 165.2 lb

## 2023-09-21 DIAGNOSIS — Z01419 Encounter for gynecological examination (general) (routine) without abnormal findings: Secondary | ICD-10-CM | POA: Diagnosis not present

## 2023-09-21 DIAGNOSIS — Z3042 Encounter for surveillance of injectable contraceptive: Secondary | ICD-10-CM

## 2023-09-21 DIAGNOSIS — Z124 Encounter for screening for malignant neoplasm of cervix: Secondary | ICD-10-CM | POA: Insufficient documentation

## 2023-09-21 NOTE — Progress Notes (Signed)
 ANNUAL EXAM Patient name: Alicia [name] MRN [id number]  Date of birth: March 26, 1976 Chief Complaint:   Gynecologic Exam  History of Present Illness:   Alicia [name] is a 48 y.o. [id number] being seen today for a routine annual exam.  Current complaints: due for pap, intermittent abdominal pain  Menstrual concerns? No  amenorrhea with depo Breast or nipple changes? No  Contraception use? Yes depo Sexually active? Yes female partner, intermittently uncomfortable but different pain from abdominal pain  Patient reports intermittent right lower quadrant pain.  No clear trigger for pain.  No aggravating or alleviating factors known.  Denies changes in bowel habits.  Pain started occurring after resting with Depo.  Pain is not the same pain that she experiences with intercourse.  No other symptoms associated with pain.  Patient's last menstrual period was 07/27/2023 (within days).   The pregnancy intention screening data noted above was reviewed. Potential methods of contraception were discussed. The patient elected to proceed with No data recorded.   Last pap     Component Value Date/Time   DIAGPAP  10/26/2019 1059    - Negative for intraepithelial lesion or malignancy (NILM)   DIAGPAP  06/03/2017 0000    NEGATIVE FOR INTRAEPITHELIAL LESIONS OR MALIGNANCY.   HPVHIGH Negative 10/26/2019 1059   ADEQPAP  10/26/2019 1059    Satisfactory for evaluation; transformation zone component PRESENT.   ADEQPAP  06/03/2017 0000    Satisfactory for evaluation  endocervical/transformation zone component PRESENT.   Last mammogram: 04/2023 BIRADS 1.  Last colonoscopy: N/A.      04/08/2023   11:05 AM 10/26/2019   10:31 AM  Depression screen PHQ 2/9  Decreased Interest 0 0  Down, Depressed, Hopeless 0 0  PHQ - 2 Score 0 0  Altered sleeping 0 0  Tired, decreased energy 0 0  Change in appetite 0 0  Feeling bad or failure about yourself  0 0  Trouble concentrating 0 0  Moving slowly or  fidgety/restless 0 0  Suicidal thoughts 0 0  PHQ-9 Score 0 0  Difficult doing work/chores  Not difficult at all        04/08/2023   11:05 AM 10/26/2019   10:31 AM  GAD 7 : Generalized Anxiety Score  Nervous, Anxious, on Edge 0 0  Control/stop worrying 0 0  Worry too much - different things 0 0  Trouble relaxing 0 0  Restless 0 0  Easily annoyed or irritable 0 0  Afraid - awful might happen 0 0  Total GAD 7 Score 0 0  Anxiety Difficulty  Not difficult at all     Review of Systems:   Pertinent items are noted in HPI Denies any headaches, blurred vision, fatigue, shortness of breath, chest pain, abdominal pain, abnormal vaginal discharge/itching/odor/irritation, problems with periods, bowel movements, urination, or intercourse unless otherwise stated above. Pertinent History Reviewed:  Reviewed past medical,surgical, social and family history.  Reviewed problem list, medications and allergies. Physical Assessment:   Vitals:   09/21/23 1009  BP: 122/87  Pulse: 82  Weight: 165 lb 4 oz (75 kg)  Body mass index is 29.27 kg/m.        Physical Examination:   General appearance - well appearing, and in no distress  Mental status - alert, oriented to person, place, and time  Psych:  She has a normal mood and affect  Skin - warm and dry, normal color, no suspicious lesions noted  Chest - effort normal, all lung fields clear  to auscultation bilaterally  Heart - normal rate and regular rhythm  Abdomen - soft, nontender, nondistended, no masses or organomegaly  Pelvic -  VULVA: normal appearing vulva with no masses, tenderness or lesions   VAGINA: normal appearing vagina with normal color and discharge, no lesions   CERVIX: normal appearing cervix without discharge or lesions, no CMT,   Thin prep pap is done with HR HPV cotesting  UTERUS: uterus is felt to be normal size, shape, consistency and nontender   ADNEXA: No adnexal masses or tenderness noted.  Extremities:  No swelling  or varicosities noted  Chaperone present for exam  No results found for this or any previous visit (from the past 24 hours).    Assessment & Plan:  1. Well woman exam with routine gynecological exam (Primary) - Cervical cancer screening: Discussed guidelines. Pap with HPV collected - STD Testing:  completed recently  - Birth Control: Discussed options and their risks, benefits and common side effects; discussed VTE with estrogen containing options. Desires: Depo - Breast Health: Encouraged self breast awareness/SBE. Teaching provided. Discussed limits of clinical breast exam for detecting breast cancer. MXR is up to date: 04/2023 - F/U 12 months and prn  - Cytology - PAP  2. Screening for cervical cancer Pap collected - Cytology - PAP  3. Surveillance for Depo-Provera  contraception Continue depo and monitor pain symptoms. Uterus and adnexa non-tender on exam, reassuring.    No orders of the defined types were placed in this encounter.   Meds: No orders of the defined types were placed in this encounter.   Follow-up: No follow-ups on file.  Kiki Pelton, MD 09/21/2023 10:14 AM

## 2023-09-23 ENCOUNTER — Encounter: Payer: Self-pay | Admitting: Obstetrics and Gynecology

## 2023-09-23 LAB — CYTOLOGY - PAP
Comment: NEGATIVE
Diagnosis: NEGATIVE
High risk HPV: NEGATIVE

## 2023-10-20 ENCOUNTER — Ambulatory Visit: Payer: Self-pay
